# Patient Record
Sex: Male | Born: 1950 | Race: White | Hispanic: No | Marital: Married | Smoking: Never smoker
Health system: Southern US, Community
[De-identification: ages and names within clinical notes are randomized; demographics above are authoritative.]

## PROBLEM LIST (undated history)

## (undated) DIAGNOSIS — F410 Panic disorder [episodic paroxysmal anxiety] without agoraphobia: Secondary | ICD-10-CM

## (undated) DIAGNOSIS — J342 Deviated nasal septum: Secondary | ICD-10-CM

## (undated) DIAGNOSIS — Z9889 Other specified postprocedural states: Secondary | ICD-10-CM

## (undated) DIAGNOSIS — N200 Calculus of kidney: Secondary | ICD-10-CM

## (undated) DIAGNOSIS — K4090 Unilateral inguinal hernia, without obstruction or gangrene, not specified as recurrent: Secondary | ICD-10-CM

## (undated) DIAGNOSIS — Z973 Presence of spectacles and contact lenses: Secondary | ICD-10-CM

## (undated) DIAGNOSIS — M549 Dorsalgia, unspecified: Secondary | ICD-10-CM

## (undated) DIAGNOSIS — Z87442 Personal history of urinary calculi: Secondary | ICD-10-CM

## (undated) DIAGNOSIS — K219 Gastro-esophageal reflux disease without esophagitis: Secondary | ICD-10-CM

## (undated) DIAGNOSIS — S82891A Other fracture of right lower leg, initial encounter for closed fracture: Secondary | ICD-10-CM

## (undated) DIAGNOSIS — C4490 Unspecified malignant neoplasm of skin, unspecified: Secondary | ICD-10-CM

## (undated) DIAGNOSIS — R112 Nausea with vomiting, unspecified: Secondary | ICD-10-CM

## (undated) HISTORY — DX: Dorsalgia, unspecified: M54.9

## (undated) HISTORY — DX: Calculus of kidney: N20.0

## (undated) HISTORY — PX: COLONOSCOPY: SHX174

## (undated) HISTORY — DX: Panic disorder (episodic paroxysmal anxiety): F41.0

## (undated) HISTORY — DX: Unilateral inguinal hernia, without obstruction or gangrene, not specified as recurrent: K40.90

## (undated) HISTORY — PX: NASAL SEPTUM SURGERY: SHX37

## (undated) HISTORY — PX: MOHS SURGERY: SUR867

## (undated) HISTORY — DX: Other fracture of right lower leg, initial encounter for closed fracture: S82.891A

## (undated) HISTORY — DX: Unspecified malignant neoplasm of skin, unspecified: C44.90

---

## 1978-10-30 HISTORY — PX: WISDOM TOOTH EXTRACTION: SHX21

## 2004-04-28 ENCOUNTER — Emergency Department (HOSPITAL_COMMUNITY): Admission: EM | Admit: 2004-04-28 | Discharge: 2004-04-28 | Payer: Self-pay | Admitting: Emergency Medicine

## 2005-02-27 ENCOUNTER — Ambulatory Visit: Payer: Self-pay | Admitting: Family Medicine

## 2005-04-04 ENCOUNTER — Ambulatory Visit: Payer: Self-pay | Admitting: Family Medicine

## 2005-04-25 ENCOUNTER — Ambulatory Visit: Payer: Self-pay | Admitting: Family Medicine

## 2005-08-06 ENCOUNTER — Emergency Department (HOSPITAL_COMMUNITY): Admission: EM | Admit: 2005-08-06 | Discharge: 2005-08-06 | Payer: Self-pay | Admitting: Emergency Medicine

## 2005-10-10 ENCOUNTER — Ambulatory Visit: Payer: Self-pay | Admitting: Family Medicine

## 2006-08-06 ENCOUNTER — Ambulatory Visit: Payer: Self-pay | Admitting: Internal Medicine

## 2006-08-09 ENCOUNTER — Ambulatory Visit: Payer: Self-pay | Admitting: Internal Medicine

## 2007-07-16 ENCOUNTER — Ambulatory Visit: Payer: Self-pay | Admitting: Family Medicine

## 2007-07-16 DIAGNOSIS — Z85828 Personal history of other malignant neoplasm of skin: Secondary | ICD-10-CM | POA: Insufficient documentation

## 2007-09-09 ENCOUNTER — Ambulatory Visit: Payer: Self-pay | Admitting: Family Medicine

## 2007-09-09 LAB — CONVERTED CEMR LAB
ALT: 33 units/L (ref 0–53)
AST: 27 units/L (ref 0–37)
Albumin: 3.8 g/dL (ref 3.5–5.2)
Alkaline Phosphatase: 53 units/L (ref 39–117)
BUN: 11 mg/dL (ref 6–23)
Basophils Absolute: 0.1 10*3/uL (ref 0.0–0.1)
Basophils Relative: 1.3 % — ABNORMAL HIGH (ref 0.0–1.0)
Bilirubin Urine: NEGATIVE
Bilirubin, Direct: 0.1 mg/dL (ref 0.0–0.3)
Blood in Urine, dipstick: NEGATIVE
CO2: 29 meq/L (ref 19–32)
Calcium: 9.3 mg/dL (ref 8.4–10.5)
Chloride: 105 meq/L (ref 96–112)
Cholesterol: 195 mg/dL (ref 0–200)
Creatinine, Ser: 0.9 mg/dL (ref 0.4–1.5)
Eosinophils Absolute: 0.1 10*3/uL (ref 0.0–0.6)
Eosinophils Relative: 2.4 % (ref 0.0–5.0)
GFR calc Af Amer: 112 mL/min
GFR calc non Af Amer: 93 mL/min
Glucose, Bld: 97 mg/dL (ref 70–99)
Glucose, Urine, Semiquant: NEGATIVE
HCT: 45.5 % (ref 39.0–52.0)
HDL: 30.6 mg/dL — ABNORMAL LOW (ref >39.0)
Hemoglobin: 16.2 g/dL (ref 13.0–17.0)
Ketones, urine, test strip: NEGATIVE
LDL Cholesterol: 141 mg/dL — ABNORMAL HIGH (ref 0–99)
Lymphocytes Relative: 37.5 % (ref 12.0–46.0)
MCHC: 35.5 g/dL (ref 30.0–36.0)
MCV: 92.5 fL (ref 78.0–100.0)
Monocytes Absolute: 0.5 10*3/uL (ref 0.2–0.7)
Monocytes Relative: 9.7 % (ref 3.0–11.0)
Neutro Abs: 2.4 10*3/uL (ref 1.4–7.7)
Neutrophils Relative %: 49.1 % (ref 43.0–77.0)
Nitrite: NEGATIVE
PSA: 5.24 ng/mL — ABNORMAL HIGH (ref 0.10–4.00)
Platelets: 215 10*3/uL (ref 150–400)
Potassium: 4.6 meq/L (ref 3.5–5.1)
Protein, U semiquant: NEGATIVE
RBC: 4.92 M/uL (ref 4.22–5.81)
RDW: 12 % (ref 11.5–14.6)
Sodium: 140 meq/L (ref 135–145)
Specific Gravity, Urine: 1.02
TSH: 0.8 microintl units/mL (ref 0.35–5.50)
Total Bilirubin: 0.6 mg/dL (ref 0.3–1.2)
Total CHOL/HDL Ratio: 6.4
Total Protein: 6.4 g/dL (ref 6.0–8.3)
Triglycerides: 115 mg/dL (ref 0–149)
Urobilinogen, UA: 0.2
VLDL: 23 mg/dL (ref 0–40)
WBC Urine, dipstick: NEGATIVE
WBC: 4.9 10*3/uL (ref 4.5–10.5)
pH: 7

## 2007-09-16 ENCOUNTER — Ambulatory Visit: Payer: Self-pay | Admitting: Family Medicine

## 2007-10-28 ENCOUNTER — Ambulatory Visit: Payer: Self-pay | Admitting: Family Medicine

## 2007-10-28 LAB — CONVERTED CEMR LAB: PSA: 5.13 ng/mL — ABNORMAL HIGH (ref 0.10–4.00)

## 2007-11-01 ENCOUNTER — Telehealth: Payer: Self-pay | Admitting: Family Medicine

## 2010-01-05 ENCOUNTER — Telehealth: Payer: Self-pay | Admitting: Internal Medicine

## 2010-03-25 ENCOUNTER — Ambulatory Visit: Payer: Self-pay | Admitting: Family Medicine

## 2010-03-25 DIAGNOSIS — L259 Unspecified contact dermatitis, unspecified cause: Secondary | ICD-10-CM | POA: Insufficient documentation

## 2010-04-19 ENCOUNTER — Ambulatory Visit: Payer: Self-pay | Admitting: Family Medicine

## 2010-07-06 ENCOUNTER — Telehealth: Payer: Self-pay | Admitting: Family Medicine

## 2010-07-12 ENCOUNTER — Ambulatory Visit: Payer: Self-pay | Admitting: Family Medicine

## 2010-07-12 LAB — CONVERTED CEMR LAB
ALT: 43 units/L (ref 0–53)
AST: 31 units/L (ref 0–37)
Albumin: 4.2 g/dL (ref 3.5–5.2)
Alkaline Phosphatase: 42 units/L (ref 39–117)
BUN: 20 mg/dL (ref 6–23)
Bilirubin Urine: NEGATIVE
Bilirubin, Direct: 0.1 mg/dL (ref 0.0–0.3)
Blood in Urine, dipstick: NEGATIVE
CO2: 28 meq/L (ref 19–32)
Calcium: 9.2 mg/dL (ref 8.4–10.5)
Chloride: 106 meq/L (ref 96–112)
Cholesterol: 213 mg/dL — ABNORMAL HIGH (ref 0–200)
Creatinine, Ser: 1 mg/dL (ref 0.4–1.5)
Direct LDL: 145.3 mg/dL
GFR calc non Af Amer: 82.09 mL/min (ref >60)
Glucose, Bld: 95 mg/dL (ref 70–99)
Glucose, Urine, Semiquant: NEGATIVE
HCT: 47.8 % (ref 39.0–52.0)
HDL: 38.4 mg/dL — ABNORMAL LOW (ref >39.00)
Hemoglobin: 16.7 g/dL (ref 13.0–17.0)
Ketones, urine, test strip: NEGATIVE
MCHC: 35 g/dL (ref 30.0–36.0)
MCV: 95.5 fL (ref 78.0–100.0)
Nitrite: NEGATIVE
PSA: 3.12 ng/mL (ref 0.10–4.00)
Platelets: 182 10*3/uL (ref 150.0–400.0)
Potassium: 4.3 meq/L (ref 3.5–5.1)
Protein, U semiquant: NEGATIVE
RBC: 5 M/uL (ref 4.22–5.81)
RDW: 13 % (ref 11.5–14.6)
Sodium: 141 meq/L (ref 135–145)
Specific Gravity, Urine: 1.02
TSH: 1.34 microintl units/mL (ref 0.35–5.50)
Total Bilirubin: 0.8 mg/dL (ref 0.3–1.2)
Total CHOL/HDL Ratio: 6
Total Protein: 6.9 g/dL (ref 6.0–8.3)
Triglycerides: 240 mg/dL — ABNORMAL HIGH (ref 0.0–149.0)
Urobilinogen, UA: 0.2
VLDL: 48 mg/dL — ABNORMAL HIGH (ref 0.0–40.0)
WBC Urine, dipstick: NEGATIVE
WBC: 5.2 10*3/uL (ref 4.5–10.5)
pH: 5.5

## 2010-07-22 ENCOUNTER — Encounter: Payer: Self-pay | Admitting: Family Medicine

## 2010-07-22 ENCOUNTER — Ambulatory Visit: Payer: Self-pay | Admitting: Family Medicine

## 2010-11-29 NOTE — Progress Notes (Signed)
Summary: REFILL REQUEST  Phone Note Refill Request Message from:  Patient on July 06, 2010 11:08 AM  Refills Requested: Medication #1:  ALPRAZOLAM 0.5 MG  TABS   Notes: [facility] - [address].    Initial call taken by: Debbra Riding,  July 06, 2010 11:08 AM  Follow-up for Phone Call        Nadine......... dispense 30 tabs directions one as needed for panic attacks.  No refills.  Also, transfer him to [facility]a......... he needs to get set up for his complete physical exam Follow-up by: Roderick Pee MD,  July 06, 2010 12:35 PM  Additional Follow-up for Phone Call Additional follow up Details #1::        Called home number he was not home,spoke with his wife she said she will motivate him to make apt. Rx called into [facility] [phone] Additional Follow-up by: Kathrynn Speed CMA,  July 06, 2010 4:39 PM     Appended Document: REFILL REQUEST Pt called and scheduled appt for cpx with Dr Tawanna Cooler on  9/21.

## 2010-11-29 NOTE — Assessment & Plan Note (Signed)
Summary: RASH ON FACE/PS   Vital Signs:  Patient profile:   60 year old male Weight:      210 pounds BMI:     31.12 Temp:     98.2 degrees F oral BP sitting:   150 / 90  (left arm) Cuff size:   regular  Vitals Entered By: Kern Reap CMA Duncan Dull) (Mar 25, 2010 1:40 PM) CC: rash on face   CC:  rash on face.  History of Present Illness: Andrew Mcmahon is a 60 year old, married male, nonsmoker, who comes in today for evaluation of a contact dermatitis.  This morning he woke up and noticed some itching of the scalp then he broke out all over on his face.  No previous history of contact dermatitis  Allergies: No Known Drug Allergies  Past History:  Past medical, surgical, family and social histories (including risk factors) reviewed for relevance to current acute and chronic problems.  Past Medical History: Reviewed history from 09/16/2007 and no changes required. Skin cancer, hx of Low back pain panic attacks  Past Surgical History: Reviewed history from 07/16/2007 and no changes required. Deviated Septum Colonoscopy  Family History: Reviewed history from 07/16/2007 and no changes required. Family History Liver disease Family History of Melanoma  Social History: Reviewed history from 07/16/2007 and no changes required. Occupation: Married Current Smoker Alcohol use-yes Regular exercise-yes  Review of Systems      See HPI  Physical Exam  General:  Well-developed,well-nourished,in no acute distress; alert,appropriate and cooperative throughout examination Skin:  skin rash, consistent with a contact dermatitis   Problems:  Medical Problems Added: 1)  Dx of Contact Dermatitis&other Eczema Due Unspec Cause  (ICD-692.9)  Impression & Recommendations:  Problem # 1:  CONTACT DERMATITIS&OTHER ECZEMA DUE UNSPEC CAUSE (ICD-692.9) Assessment New  His updated medication list for this problem includes:    Prednisone 20 Mg Tabs (Prednisone) ..... Uad  Complete  Medication List: 1)  Alprazolam 0.5 Mg Tabs (Alprazolam) 2)  Prednisone 20 Mg Tabs (Prednisone) .... Uad  Patient Instructions: 1)  begin prednisone two tabs now then two tabs every morning for 3 days, one for 3 days, a half for 3 days, then half a tablet Monday, Wednesday, Friday, for a two week taper Prescriptions: PREDNISONE 20 MG TABS (PREDNISONE) UAD  #40 x 1   Entered and Authorized by:   Roderick Pee MD   Signed by:   Roderick Pee MD on 03/25/2010   Method used:   Electronically to        [facility]. [phone]* (retail)       [phone]       [place], [place]  [address]       Ph: [phone]       Fax: [phone]   RxID:   [id number]

## 2010-11-29 NOTE — Assessment & Plan Note (Signed)
Summary: CPX // RS----PT [facility] // RS   Vital Signs:  Patient profile:   60 year old male Height:      69 inches Weight:      207 pounds BMI:     30.68 Temp:     98.2 degrees F oral BP sitting:   130 / 80  (left arm) Cuff size:   regular  Vitals Entered By: Kern Reap CMA Duncan Dull) (July 22, 2010 2:30 PM) CC: cpx Is Patient Diabetic? No Pain Assessment Patient in pain? no        CC:  cpx.  History of Present Illness: Andrew Mcmahon is a 60 year old, married male, nonsmoker, who comes in today for general physical examination  He's always been in excellent health.  He said no chronic health problems.  He takes no medication on a regular basis.  He has had some skin cancers........ nonmelanoma........ in the past.  Is now meticulous about sunscreens.  He exercises on a daily basis swimming and push-ups, however, descending, trouble with his right shoulder.  Advised to stop the push-ups.  He takes alprazolam .5 p.r.n. for panic attacks.  It [place] the [place] these.  He gets routine eye care, dental care, colonoscopy, normal 6 years ago, tetanus, 2004  Allergies (verified): No Known Drug Allergies  Past History:  Past medical, surgical, family and social histories (including risk factors) reviewed, and no changes noted (except as noted below).  Past Medical History: Reviewed history from 09/16/2007 and no changes required. Skin cancer, hx of Low back pain panic attacks  Past Surgical History: Reviewed history from 07/16/2007 and no changes required. Deviated Septum Colonoscopy  Family History: Reviewed history from 07/16/2007 and no changes required. Family History Liver disease Family History of Melanoma  Social History: Reviewed history from 07/16/2007 and no changes required. Occupation: Married Current Smoker Alcohol use-yes Regular exercise-yes  Review of Systems      See HPI  Physical Exam  General:   Well-developed,well-nourished,in no acute distress; alert,appropriate and cooperative throughout examination Head:  Normocephalic and atraumatic without obvious abnormalities. No apparent alopecia or balding. Eyes:  No corneal or conjunctival inflammation noted. EOMI. Perrla. Funduscopic exam benign, without hemorrhages, exudates or papilledema. Vision grossly normal. Ears:  External ear exam shows no significant lesions or deformities.  Otoscopic examination reveals clear canals, tympanic membranes are intact bilaterally without bulging, retraction, inflammation or discharge. Hearing is grossly normal bilaterally. Nose:  External nasal examination shows no deformity or inflammation. Nasal mucosa are pink and moist without lesions or exudates. Mouth:  Oral mucosa and oropharynx without lesions or exudates.  Teeth in good repair. Neck:  No deformities, masses, or tenderness noted. Chest Wall:  No deformities, masses, tenderness or gynecomastia noted. Breasts:  No masses or gynecomastia noted Lungs:  Normal respiratory effort, chest expands symmetrically. Lungs are clear to auscultation, no crackles or wheezes. Heart:  Normal rate and regular rhythm. S1 and S2 normal without gallop, murmur, click, rub or other extra sounds. Abdomen:  Bowel sounds positive,abdomen soft and non-tender without masses, organomegaly or hernias noted. Rectal:  No external abnormalities noted. Normal sphincter tone. No rectal masses or tenderness. Genitalia:  Testes bilaterally descended without nodularity, tenderness or masses. No scrotal masses or lesions. No penis lesions or urethral discharge. Prostate:  Prostate gland firm and smooth, no enlargement, nodularity, tenderness, mass, asymmetry or induration. Msk:  No deformity or scoliosis noted of thoracic or lumbar spine.   Pulses:  R and L carotid,radial,femoral,dorsalis pedis and  posterior tibial pulses are full and equal bilaterally Extremities:  No clubbing, cyanosis,  edema, or deformity noted with normal full range of motion of all joints.   Neurologic:  No cranial nerve deficits noted. Station and gait are normal. Plantar reflexes are down-going bilaterally. DTRs are symmetrical throughout. Sensory, motor and coordinative functions appear intact. Skin:  Intact without suspicious lesions or rashes Cervical Nodes:  No lymphadenopathy noted Axillary Nodes:  No palpable lymphadenopathy Inguinal Nodes:  No significant adenopathy Psych:  Cognition and judgment appear intact. Alert and cooperative with normal attention span and concentration. No apparent delusions, illusions, hallucinations   Impression & Recommendations:  Problem # 1:  Preventive Health Care (ICD-V70.0) Assessment Unchanged  Complete Medication List: 1)  Alprazolam 0.5 Mg Tabs (Alprazolam)  Other Orders: EKG w/ Interpretation (93000) Flu Vaccine 52yrs + (16109) Admin 1st Vaccine (60454)  Patient Instructions: 1)  Please schedule a follow-up appointment in 1 year. 2)  It is important that you exercise regularly at least 20 minutes 5 times a week. If you develop chest pain, have severe difficulty breathing, or feel very tired , stop exercising immediately and seek medical attention. 3)  Schedule a colonoscopy/sigmoidoscopy to help detect colon cancer. 4)  Take an Aspirin every day.   Immunizations Administered:  Influenza Vaccine # 1:    Vaccine Type: Fluvax 3+    Site: left deltoid    Mfr: GlaxoSmithKline    Dose: 0.5 ml    Route: IM    Given by: Kern Reap CMA (AAMA)    Exp. Date: 04/29/2011    Lot #: [id number]    VIS given: 05/24/10 version given July 22, 2010.    Physician counseled: yes

## 2010-11-29 NOTE — Procedures (Signed)
Summary: COLONOSCOPY    Patient Name: Andrew Mcmahon, Andrew Mcmahon MRN:  Procedure Procedures: Colonoscopy CPT: [phone].  Personnel: Endoscopist: Laquonda Welby L. Juanda Chance, MD.  Referred By: Eugenio Hoes Tawanna Cooler, MD.  Exam Location: Exam performed in Outpatient Clinic. Outpatient  Patient Consent: Procedure, Alternatives, Risks and Benefits discussed, consent obtained, from patient. Consent was obtained by the RN.  Indications  Average Risk Screening Routine.  History  Current Medications: Patient is not currently taking Coumadin.  Pre-Exam Physical: Performed Aug 09, 2006. Entire physical exam was normal.  Comments: Pt. history reviewed/updated, physical exam performed prior to initiation of sedation?yes Exam Exam: Extent of exam reached: Cecum, extent intended: Cecum.  The cecum was identified by appendiceal orifice and IC valve. Colon retroflexion performed. Images taken. ASA Classification: I. Tolerance: good.  Monitoring: Pulse and BP monitoring, Oximetry used. Supplemental O2 given.  Colon Prep Used Moviprep for colon prep. Prep results: good.  Sedation Meds: Patient assessed and found to be appropriate for moderate (conscious) sedation. Fentanyl 100 mcg. given IV. Versed 9 mg. given IV.  Findings - NORMAL EXAM: Cecum.  - DIVERTICULOSIS: Descending Colon to Sigmoid Colon. ICD9: Diverticulosis: 562.10. Comments: mild sigmoid diverticulosis.  - HEMORRHOIDS: Internal. Size: Grade I. ICD9: Hemorrhoids, Internal: 455.0. Comments: small internal hemorrhoids.   Assessment Normal examination.  Diagnoses: 455.0: Hemorrhoids, Internal.  562.10: Diverticulosis.   Comments: no polyps Events  Unplanned Interventions: No intervention was required.  Unplanned Events: There were no complications. Plans Patient Education: Patient given standard instructions for: Patient instructed to get routine colonoscopy every 10 years.  Comments: follow up with Dr Tawanna Cooler Disposition: After procedure  patient sent to recovery. After recovery patient sent home.    CC: Tinnie Gens A.Todd MD  This report was created from the original endoscopy report, which was reviewed and signed by the above listed endoscopist.

## 2010-11-29 NOTE — Progress Notes (Signed)
Summary: TRIAGE- hemorroids  Phone Note Call from Patient Call back at Home Phone [phone]   Caller: Patient Call For: Andrew Mcmahon Reason for Call: Talk to Nurse Summary of Call: Wife states that patient is having a lot of problems with hemorroids wants to know if Dr Andrew Mcmahon can referred him to a hemorroid specialist or to have them removed. Initial call taken by: Tawni Levy,  January 05, 2010 9:12 AM  Follow-up for Phone Call        Pt. had a Colonoscopy on 08-09-2006, hasn't ever been seen in the office. Per pt. wife-since 2007 pt. has intermittent problems with hemorrhoids. Has rectal pain, burning and itching. Not sure if he is bleeding. He does prep.H treatment, does sitz baths, applies witch hazel as needed. Wife states he is traveling out of town at this time. I have advised for him to continue above treatment and to see Dr.Lexiana Spindel for a complete exam and further treatment. I offered an appt. for 01-10-10, pt. wife declines to schedule, states pt. will need to call and set up appointment himself. Pt. wife will have him call to get appt. scheduled.  Follow-up by: Laureen Ochs LPN,  January 05, 2010 10:33 AM  Additional Follow-up for Phone Call Additional follow up Details #1::        Anusol HC supp Insert 1 two times a day  Additional Follow-up by: Hart Carwin MD,  January 05, 2010 4:35 PM    Additional Follow-up for Phone Call Additional follow up Details #2::    Above MD orders reviewed with patient's wife. Pt. will call next week to schedule an appt. with Dr.Corvette Orser. Pt. instructed to call back as needed.  Follow-up by: Laureen Ochs LPN,  January 05, 2010 5:04 PM  New/Updated Medications: ANUSOL-HC 25 MG  SUPP (HYDROCORTISONE ACETATE) Put 1 in rectum 2 times daily for 7 days. Prescriptions: ANUSOL-HC 25 MG  SUPP (HYDROCORTISONE ACETATE) Put 1 in rectum 2 times daily for 7 days.  #14 x 1   Entered by:   Laureen Ochs LPN   Authorized by:   Hart Carwin MD   Signed by:    Laureen Ochs LPN on 46/96/2952   Method used:   Electronically to        [facility]. [phone]* (retail)       [phone]       [place], [place]  [address]       Ph: [phone]       Fax: [phone]   RxID:   [id number]

## 2010-11-29 NOTE — Assessment & Plan Note (Signed)
Summary: ear stopped up./dm   Vital Signs:  Patient profile:   60 year old male Weight:      208 pounds Temp:     98.2 degrees F oral BP sitting:   140 / 90  (left arm) Cuff size:   regular  Vitals Entered By: Kern Reap CMA Duncan Dull) (April 19, 2010 11:51 AM) CC: ears clogged   CC:  ears clogged.  History of Present Illness: Andrew Mcmahon is a 60 year old, married male, nonsmoker, who comes in today for evaluation of bilateral hearing loss.  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 1 or 2 gtts at bedtime  Patient Instructions: 1)  if u  developed pain in your ears, used the medicated drops for a week, one or two drops at bedtime Prescriptions: CORTISPORIN-TC 3.12-30-08-0.5 MG/ML SUSP (NEOMYCIN-COLIST-HC-THONZONIUM) 1 or 2 gtts at bedtime  #10 cc x 2   Entered and Authorized by:   Roderick Pee MD   Signed by:   Roderick Pee MD on 04/19/2010   Method used:     RxID:   [id number]

## 2010-12-27 ENCOUNTER — Ambulatory Visit (INDEPENDENT_AMBULATORY_CARE_PROVIDER_SITE_OTHER): Payer: BC Managed Care – PPO | Admitting: Family Medicine

## 2010-12-27 ENCOUNTER — Encounter: Payer: Self-pay | Admitting: Family Medicine

## 2010-12-27 DIAGNOSIS — Z1833 Retained wood fragments: Secondary | ICD-10-CM

## 2010-12-27 NOTE — Progress Notes (Signed)
  Subjective:    Patient ID: Andrew Mcmahon, male    DOB: Oct 23, 1951, 60 y.o.   MRN: [id number]  HPI  Andrew Mcmahon is a 60 year old man now nonsmoker, who comes in today for removal of a foreign body in his left thumb x 2 weeks.  He states that two weeks ago.  He got a splinter in his left thumb and it won't come out.  He try cutting it out at home.  Review of Systems    Negative Objective:   Physical Exam    Well-developed well-nourished, male in no acute distress.  Examination left thumb shows a pustule at the distal tip.  After alcohol prep.  The pus was expressed.  A half-inch to splint was removed.  Dry sterile dressing was applied.  He is advised on soaking in wound care.  Return p.r.n.    Assessment & Plan:  Foreign body, left thumb........[name] Kitchen Removed without difficulty.  Return p.r.n.

## 2010-12-27 NOTE — Patient Instructions (Signed)
Warm soaks twice daily.  Return p.r.n.

## 2011-03-23 ENCOUNTER — Encounter: Payer: Self-pay | Admitting: Family Medicine

## 2011-03-23 ENCOUNTER — Ambulatory Visit (INDEPENDENT_AMBULATORY_CARE_PROVIDER_SITE_OTHER): Payer: BC Managed Care – PPO | Admitting: Family Medicine

## 2011-03-23 VITALS — BP 140/80 | Temp 98.2°F | Ht 71.0 in | Wt 208.0 lb

## 2011-03-23 DIAGNOSIS — H9201 Otalgia, right ear: Secondary | ICD-10-CM

## 2011-03-23 DIAGNOSIS — H9209 Otalgia, unspecified ear: Secondary | ICD-10-CM

## 2011-03-23 MED ORDER — CIPROFLOXACIN-HYDROCORTISONE 0.2-1 % OT SUSP: OTIC | Status: AC

## 2011-03-23 NOTE — Progress Notes (Signed)
  Subjective:    Patient ID: LEVEON PELZER, male    DOB: Mar 29, 1951, 61 y.o.   MRN: [id number]  HPI       Viggo is a 60 year old male, who comes in today for evaluation of pain in his right ear for two days.  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  He said no fever, chills, et Karie Soda.    Review of Systems General an ENT review of systems otherwise negative    Objective:   Physical Exam    Well-developed well-nourished man in no acute distress.  Right ear shows cerumen impaction, which was removed by suction.  No complications in her canal now looks normal    Assessment & Plan:  Right ear pain, secondary to ear wax.  Return p.r.n.

## 2011-03-23 NOTE — Patient Instructions (Signed)
Use the Cipro otic drops as needed

## 2011-06-29 ENCOUNTER — Ambulatory Visit: Payer: BC Managed Care – PPO | Admitting: Family Medicine

## 2011-07-26 ENCOUNTER — Encounter: Payer: Self-pay | Admitting: Family Medicine

## 2011-07-26 ENCOUNTER — Ambulatory Visit (INDEPENDENT_AMBULATORY_CARE_PROVIDER_SITE_OTHER): Payer: BC Managed Care – PPO | Admitting: Family Medicine

## 2011-07-26 VITALS — BP 140/84 | Temp 98.4°F | Wt 201.0 lb

## 2011-07-26 DIAGNOSIS — R0683 Snoring: Secondary | ICD-10-CM

## 2011-07-26 DIAGNOSIS — R0609 Other forms of dyspnea: Secondary | ICD-10-CM

## 2011-07-26 DIAGNOSIS — D0439 Carcinoma in situ of skin of other parts of face: Secondary | ICD-10-CM

## 2011-07-26 DIAGNOSIS — Z85828 Personal history of other malignant neoplasm of skin: Secondary | ICD-10-CM

## 2011-07-26 DIAGNOSIS — L989 Disorder of the skin and subcutaneous tissue, unspecified: Secondary | ICD-10-CM

## 2011-07-26 DIAGNOSIS — D0430 Carcinoma in situ of skin of unspecified part of face: Secondary | ICD-10-CM

## 2011-07-26 DIAGNOSIS — F410 Panic disorder [episodic paroxysmal anxiety] without agoraphobia: Secondary | ICD-10-CM

## 2011-07-26 LAB — BASIC METABOLIC PANEL
BUN: 18 mg/dL (ref 6–23)
CO2: 25 mEq/L (ref 19–32)
Calcium: 9.2 mg/dL (ref 8.4–10.5)
Chloride: 104 mEq/L (ref 96–112)
Creatinine, Ser: 1 mg/dL (ref 0.4–1.5)
GFR: 83.76 mL/min (ref >60.00)
Glucose, Bld: 87 mg/dL (ref 70–99)
Potassium: 4.3 mEq/L (ref 3.5–5.1)
Sodium: 140 mEq/L (ref 135–145)

## 2011-07-26 LAB — HEPATIC FUNCTION PANEL
ALT: 30 U/L (ref 0–53)
AST: 23 U/L (ref 0–37)
Albumin: 4.3 g/dL (ref 3.5–5.2)
Alkaline Phosphatase: 58 U/L (ref 39–117)
Bilirubin, Direct: 0.1 mg/dL (ref 0.0–0.3)
Total Bilirubin: 0.6 mg/dL (ref 0.3–1.2)
Total Protein: 7.3 g/dL (ref 6.0–8.3)

## 2011-07-26 LAB — POCT URINALYSIS DIPSTICK
Bilirubin, UA: NEGATIVE
Blood, UA: NEGATIVE
Glucose, UA: NEGATIVE
Ketones, UA: NEGATIVE
Leukocytes, UA: NEGATIVE
Nitrite, UA: NEGATIVE
Protein, UA: NEGATIVE
Spec Grav, UA: 1.02
Urobilinogen, UA: 0.2
pH, UA: 6

## 2011-07-26 LAB — CBC WITH DIFFERENTIAL/PLATELET
Basophils Absolute: 0.1 10*3/uL (ref 0.0–0.1)
Basophils Relative: 1.1 % (ref 0.0–3.0)
Eosinophils Absolute: 0.1 10*3/uL (ref 0.0–0.7)
Eosinophils Relative: 2 % (ref 0.0–5.0)
HCT: 49.1 % (ref 39.0–52.0)
Hemoglobin: 16.7 g/dL (ref 13.0–17.0)
Lymphocytes Relative: 35.4 % (ref 12.0–46.0)
Lymphs Abs: 1.9 10*3/uL (ref 0.7–4.0)
MCHC: 34 g/dL (ref 30.0–36.0)
MCV: 95.2 fl (ref 78.0–100.0)
Monocytes Absolute: 0.5 10*3/uL (ref 0.1–1.0)
Monocytes Relative: 9.2 % (ref 3.0–12.0)
Neutro Abs: 2.9 10*3/uL (ref 1.4–7.7)
Neutrophils Relative %: 52.3 % (ref 43.0–77.0)
Platelets: 220 10*3/uL (ref 150.0–400.0)
RBC: 5.16 Mil/uL (ref 4.22–5.81)
RDW: 13.1 % (ref 11.5–14.6)
WBC: 5.5 10*3/uL (ref 4.5–10.5)

## 2011-07-26 LAB — LIPID PANEL
Cholesterol: 232 mg/dL — ABNORMAL HIGH (ref 0–200)
HDL: 40.8 mg/dL (ref >39.00)
Total CHOL/HDL Ratio: 6
Triglycerides: 266 mg/dL — ABNORMAL HIGH (ref 0.0–149.0)
VLDL: 53.2 mg/dL — ABNORMAL HIGH (ref 0.0–40.0)

## 2011-07-26 LAB — PSA: PSA: 2.68 ng/mL (ref 0.10–4.00)

## 2011-07-26 LAB — TSH: TSH: 0.92 u[IU]/mL (ref 0.35–5.50)

## 2011-07-26 LAB — LDL CHOLESTEROL, DIRECT: Direct LDL: 158.9 mg/dL

## 2011-07-26 MED ORDER — ALPRAZOLAM 0.5 MG PO TABS: 0.5000 mg | ORAL_TABLET | Freq: Every evening | ORAL | Status: DC | PRN

## 2011-07-26 NOTE — Progress Notes (Signed)
  Subjective:    Patient ID: Andrew Mcmahon, male    DOB: 12/10/1950, 60 y.o.   MRN: [id number]  HPI  Andrew Mcmahon is a 60-year-old male, who comes in today with 4 questions.  He has a nonhealing lesion on the right side of his face he would like removed.  He would like a refill on his Xanax which he takes .5 mg nightly p.r.n.  He takes less than 30 tablets a year.  His wife is complaining because she thinks he has sleep apnea.  He would like an evaluation.  The symptoms are gasping for air snoring, but no fatigue during the day.  He will also like to discuss his shingles.  Vaccine.  After informed consent he was taken to the treatment room.  The lesion was cleaned with alcohol.  It was measured 8 mm x 8 mm shave excision was done.  The base was cauterized.  He tolerated the procedure well.  No complications.  The lesion was sent for pathologic analysis.  Review of Systems    General psychiatric, dermatologic review of systems otherwise negative except for previous history of skin cancer. Objective:   Physical Exam  Procedure see above      Assessment & Plan:  Abnormal lesion, right side of face removed path pending.  History is possible sleep apnea.  Pulmonary consult.  History of occasional anxiety attacks, Xanax, .5, p.r.n.  Check shingles vaccine with insurance company

## 2011-07-26 NOTE — Patient Instructions (Addendum)
We will call you within two weeks with the path report on the lesion are removed from her face.  I put in a request for a consult with Dr. Mellody Dance pulmonary dilation for possible sleep apnea.  Return for your annual physical examination sooner if any problems.  Call us if you decide you want to shingles faxing  We will do your lab work today and set up a 30 minute appointment sometime in the next couple weeks for general physical examination  On Monday, October 1, I get the path report, which showed a squamous cell carcinoma.  He was referred to Dr. Margo Aye for further surgical treatment

## 2011-08-10 ENCOUNTER — Encounter: Payer: Self-pay | Admitting: Family Medicine

## 2011-08-10 ENCOUNTER — Ambulatory Visit (INDEPENDENT_AMBULATORY_CARE_PROVIDER_SITE_OTHER): Payer: BC Managed Care – PPO | Admitting: Family Medicine

## 2011-08-10 DIAGNOSIS — R0683 Snoring: Secondary | ICD-10-CM

## 2011-08-10 DIAGNOSIS — R0609 Other forms of dyspnea: Secondary | ICD-10-CM

## 2011-08-10 DIAGNOSIS — L989 Disorder of the skin and subcutaneous tissue, unspecified: Secondary | ICD-10-CM

## 2011-08-10 DIAGNOSIS — Z23 Encounter for immunization: Secondary | ICD-10-CM

## 2011-08-10 DIAGNOSIS — F410 Panic disorder [episodic paroxysmal anxiety] without agoraphobia: Secondary | ICD-10-CM

## 2011-08-10 DIAGNOSIS — Z85828 Personal history of other malignant neoplasm of skin: Secondary | ICD-10-CM

## 2011-08-10 DIAGNOSIS — Z2911 Encounter for prophylactic immunotherapy for respiratory syncytial virus (RSV): Secondary | ICD-10-CM

## 2011-08-10 DIAGNOSIS — Z0000 Encounter for general adult medical examination without abnormal findings: Secondary | ICD-10-CM

## 2011-08-10 DIAGNOSIS — D0439 Carcinoma in situ of skin of other parts of face: Secondary | ICD-10-CM

## 2011-08-10 DIAGNOSIS — D0430 Carcinoma in situ of skin of unspecified part of face: Secondary | ICD-10-CM

## 2011-08-10 MED ORDER — ALPRAZOLAM 0.5 MG PO TABS: 0.5000 mg | ORAL_TABLET | Freq: Every evening | ORAL | Status: DC | PRN

## 2011-08-10 NOTE — Patient Instructions (Signed)
Continue y , good health habits.  Return in one year for general physical examination sooner if any problem

## 2011-08-10 NOTE — Progress Notes (Signed)
  Subjective:    Patient ID: Andrew Mcmahon, male    DOB: Jun 30, 1951, 60 y.o.   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  He has no chronic health problems.  He takes a half of 8.5, Xanax tablet [place]  He has some mini type panic attacks associated with driving.  He stated does see Dr. Irene Limbo next week for Mohs surgery.  We excised the lesion on the right side of his face.  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  He takes no medicine on a regular basis except for the Xanax as noted above.  P.r.n., aspirin, facial and probiotics    Review of Systems  Constitutional: Negative.   HENT: Negative.   Eyes: Negative.   Respiratory: Negative.   Cardiovascular: Negative.   Gastrointestinal: Negative.   Genitourinary: Negative.   Musculoskeletal: Negative.   Skin: Negative.   Neurological: Negative.   Hematological: Negative.   Psychiatric/Behavioral: Negative.        Objective:   Physical Exam  Constitutional: He is oriented to person, place, and time. He appears well-developed and well-nourished.  HENT:  Head: Normocephalic and atraumatic.  Right Ear: External ear normal.  Left Ear: External ear normal.  Nose: Nose normal.  Mouth/Throat: Oropharynx is clear and moist.  Eyes: Conjunctivae and EOM are normal. Pupils are equal, round, and reactive to light.  Neck: Normal range of motion. Neck supple. No JVD present. No tracheal deviation present. No thyromegaly present.  Cardiovascular: Normal rate, regular rhythm, normal heart sounds and intact distal pulses.  Exam reveals no gallop and no friction rub.   No murmur heard. Pulmonary/Chest: Effort normal and breath sounds normal. No stridor. No respiratory distress. He has no wheezes. He has no rales. He exhibits no tenderness.  Abdominal: Soft. Bowel sounds  are normal. He exhibits no distension and no mass. There is no tenderness. There is no rebound and no guarding.  Genitourinary: Rectum normal, prostate normal and penis normal. Guaiac negative stool. No penile tenderness.  Musculoskeletal: Normal range of motion. He exhibits no edema and no tenderness.  Lymphadenopathy:    He has no cervical adenopathy.  Neurological: He is alert and oriented to person, place, and time. He has normal reflexes. No cranial nerve deficit. He exhibits normal muscle tone.  Skin: Skin is warm and dry. No rash noted. No erythema. No pallor.       Superficial scar, right face, where I removed the lesion two weeks ago.  Well-healed total body skin exam otherwise, negative  Psychiatric: He has a normal mood and affect. His behavior is normal. Judgment and thought content normal.          Assessment & Plan:  Healthy male.  History of many panic attacks [place].  Continue .25 of Xanax p.r.n.  Squamous cell carcinoma, right face Mohs surgery next week.  Dr. Irene Limbo.  Follow-up in one year or sooner if any problems

## 2011-08-14 ENCOUNTER — Encounter: Payer: Self-pay | Admitting: Pulmonary Disease

## 2011-08-14 ENCOUNTER — Ambulatory Visit (INDEPENDENT_AMBULATORY_CARE_PROVIDER_SITE_OTHER): Payer: BC Managed Care – PPO | Admitting: Pulmonary Disease

## 2011-08-14 VITALS — BP 122/70 | HR 62 | Temp 98.4°F | Ht 71.0 in | Wt 204.6 lb

## 2011-08-14 DIAGNOSIS — R0609 Other forms of dyspnea: Secondary | ICD-10-CM

## 2011-08-14 DIAGNOSIS — R0683 Snoring: Secondary | ICD-10-CM | POA: Insufficient documentation

## 2011-08-14 NOTE — Progress Notes (Signed)
  Subjective:    Patient ID: Andrew Mcmahon, male    DOB: 07/18/1951, 60 y.o.   MRN: [id number]  HPI The patient is a 60 year old male who I've been asked to see for possible sleep apnea.  He has been noted to have loud snoring, and his spouse has witnessed apnea at times.  He also admits to having snoring arousals.  He feels rested most mornings, and describes only mild sleep pressure with reading primarily in the afternoons.  He can catnap at times during the afternoon.  He describes some sleepiness in the evenings while watching television or movies, but has no issues with sleepiness while driving.  The patient states that his weight is neutral over the last 2 years, and his Epworth score today is 10.  Sleep Questionnaire: What time do you typically go to bed?( Between what hours) 10pm How long does it take you to fall asleep? 20 mins How many times during the night do you wake up? 1 What time do you get out of bed to start your day? 0630 Do you drive or operate heavy machinery in your occupation? No How much has your weight changed (up or down) over the past two years? (In pounds) 0 oz (0 kg) Have you ever had a sleep study before? No Do you currently use CPAP? No Do you wear oxygen at any time? No     Review of Systems  Constitutional: Negative for fever and unexpected weight change.  HENT: Positive for dental problem. Negative for ear pain, nosebleeds, congestion, sore throat, rhinorrhea, sneezing, trouble swallowing, postnasal drip and sinus pressure.   Eyes: Negative for redness and itching.  Respiratory: Negative for cough, chest tightness, shortness of breath and wheezing.   Cardiovascular: Negative for palpitations and leg swelling.  Gastrointestinal: Negative for nausea and vomiting.  Genitourinary: Negative for dysuria.  Musculoskeletal: Negative for joint swelling.  Skin: Negative for rash.  Neurological: Negative for headaches.  Hematological: Does not bruise/bleed easily.    Psychiatric/Behavioral: Negative for dysphoric mood. The patient is not nervous/anxious.        Objective:   Physical Exam Constitutional:  Well developed, no acute distress  HENT:  Nares patent without discharge, deviated septum to left with narrowing.   Oropharynx without exudate, palate and uvula are elongated and thickened.    Eyes:  Perrla, eomi, no scleral icterus  Neck:  No JVD, no TMG  Cardiovascular:  Normal rate, regular rhythm, no rubs or gallops.  No murmurs        Intact distal pulses  Pulmonary :  Normal breath sounds, no stridor or respiratory distress   No rales, rhonchi, or wheezing  Abdominal:  Soft, nondistended, bowel sounds present.  No tenderness noted.   Musculoskeletal:  No lower extremity edema noted.  Lymph Nodes:  No cervical lymphadenopathy noted  Skin:  No cyanosis noted  Neurologic:  Alert, appropriate, moves all 4 extremities without obvious deficit.         Assessment & Plan:

## 2011-08-14 NOTE — Patient Instructions (Signed)
Will schedule for home sleep testing to put the issue of possible sleep apnea to rest. Work on weight loss Will call once results are available.

## 2011-08-14 NOTE — Assessment & Plan Note (Signed)
The patient has loud snoring by history, and his family has witnessed probable apneic episodes.  He denies having nonrestorative sleep, but does have some sleep pressure during the day with reading, and also in the evenings while watching TV or movies.  It is unclear whether he has symptomatic snoring, or frank sleep apnea that may be clinically significant.  A sleep study will be needed to differentiate.  Because the patient is fairly healthy, he would be a good candidate for home sleep testing.

## 2011-09-22 ENCOUNTER — Ambulatory Visit (INDEPENDENT_AMBULATORY_CARE_PROVIDER_SITE_OTHER): Payer: BC Managed Care – PPO | Admitting: Internal Medicine

## 2011-09-22 ENCOUNTER — Encounter: Payer: Self-pay | Admitting: Internal Medicine

## 2011-09-22 VITALS — BP 150/90 | Temp 97.8°F | Wt 203.0 lb

## 2011-09-22 DIAGNOSIS — M25559 Pain in unspecified hip: Secondary | ICD-10-CM

## 2011-09-22 DIAGNOSIS — M25551 Pain in right hip: Secondary | ICD-10-CM

## 2011-09-22 DIAGNOSIS — M549 Dorsalgia, unspecified: Secondary | ICD-10-CM | POA: Insufficient documentation

## 2011-09-22 DIAGNOSIS — R030 Elevated blood-pressure reading, without diagnosis of hypertension: Secondary | ICD-10-CM

## 2011-09-22 NOTE — Patient Instructions (Addendum)
Antiinflammatory    And pain med for now . Ok to use hydrocodone if needed.  As add on med.  Take 2 - 2.5    Twice a day for 10 days   Will do ortho  Consults.

## 2011-09-22 NOTE — Progress Notes (Signed)
  Subjective:    Patient ID: Andrew Mcmahon, male    DOB: 1951/01/24, 60 y.o.   MRN: [id number]  HPI  Patient comes in today for SDA  For acute problem evaluation. Onset 5 days ago and no injury Insidious onset . Began with RLB ( as in past )  Then went to y and used whirlpool and streth and then   Got worse AN NOW ACHING TO RIGHT LATERAL HIP.   The 600 ibupropen  Some help initially but  in last 2 days no effect.  Now having right thigh leg. Area .  Some aching to thigh. Knee ok   Tried otc pain patch.  An no help.  An continued ibuprofen .    Worse after driving.  Tried left over pain med from root canal.    Some help. ( hydrocodone)  Doesn't feel  Like renal stone.  He has had many of these.  Review of Systems NO fever  Hematuria  And no NVD numbness for falling  Never had HT  No cp sob swelling    No hx of HT and has been good . No excess  Etoh.   Past history family history social history reviewed in the electronic medical record. Hx of renal stones sees urolo;ist when needed    Objective:   Physical Exam [place] in [place]  Walks with a slight limp and tight right LB muscles  Abdomen:  Sof,t normal bowel sounds without hepatosplenomegaly, no guarding rebound or masses no CVA tendernessTender right si area  Right lateral hip trochanteric area  Goo rom hip pain with toe walk   Knee  rom  With crepitus but intact no effusion No groin mass . Neuro appears non focal and good le strength  Skin no acute rashes nl turgor.  CV s1 s2 no g or m repeat bp right arm  150/90 reg cuff      Assessment & Plan:  Right  LB and lateral hip pain with intermittent radiation  Seems MS in origin but has hx of renal stones Disc  Antiinflammatory rx   naproxyn bid for 10 days  Will need imaging but will plan on ortho consult and let them do that.  Ok to add on   Hydrocodone if needed in the meantime  Elevated BP readings poss from pain  Check readings at home and fu with pcp if  persistent or  progressive   Total visit > 50% spent counseling and coordinating care

## 2011-12-06 ENCOUNTER — Ambulatory Visit (INDEPENDENT_AMBULATORY_CARE_PROVIDER_SITE_OTHER): Payer: BC Managed Care – PPO | Admitting: Family Medicine

## 2011-12-06 ENCOUNTER — Encounter: Payer: Self-pay | Admitting: Family Medicine

## 2011-12-06 DIAGNOSIS — M6740 Ganglion, unspecified site: Secondary | ICD-10-CM

## 2011-12-06 DIAGNOSIS — M67479 Ganglion, unspecified ankle and foot: Secondary | ICD-10-CM

## 2011-12-06 NOTE — Patient Instructions (Signed)
Return when necessary 

## 2011-12-06 NOTE — Progress Notes (Signed)
  Subjective:    Patient ID: Andrew Mcmahon, male    DOB: 08-16-1951, 61 y.o.   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 The area was cleaned with Betadine 1% Xylocaine plain was used for anesthesia. A small incision was made the cyst was excised Band-Aid was applied he tolerated the procedure no complications   Review of Systems General and dermatologic review of systems otherwise negative    Objective:   Physical Exam  Procedure see above      Assessment & Plan:  Mucinous cyst excised

## 2012-02-05 ENCOUNTER — Ambulatory Visit (INDEPENDENT_AMBULATORY_CARE_PROVIDER_SITE_OTHER): Payer: BC Managed Care – PPO | Admitting: Family Medicine

## 2012-02-05 ENCOUNTER — Encounter: Payer: Self-pay | Admitting: Family Medicine

## 2012-02-05 VITALS — BP 110/78 | Temp 98.1°F | Wt 195.0 lb

## 2012-02-05 DIAGNOSIS — M6740 Ganglion, unspecified site: Secondary | ICD-10-CM

## 2012-02-05 DIAGNOSIS — M67479 Ganglion, unspecified ankle and foot: Secondary | ICD-10-CM

## 2012-02-05 DIAGNOSIS — H900 Conductive hearing loss, bilateral: Secondary | ICD-10-CM

## 2012-02-05 NOTE — Progress Notes (Signed)
  Subjective:    Patient ID: Andrew Mcmahon, male    DOB: Mar 20, 1951, 61 y.o.   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 It has since recurred. We discussed options including observation or seen Dr. Victorino Dike for excision  He also has bilateral hearing loss he's had bilateral cerumen impactions in the past   Review of Systems      general and dermatologic review of systems otherwise negative Objective:   Physical Exam Well-developed well-nourished male in no acute distress examination the ears shows bilateral cerumen impactions which were relieved by irrigation  Examination foot shows a dime-sized mucocele dorsum second toe left foot       Assessment & Plan:  Hearing loss secondary to cerumen impaction removed  Mucocele toe referred to Dr. Toni Arthurs

## 2012-02-05 NOTE — Patient Instructions (Signed)
If you would like another excision of the cyst on your toe I would recommend Dr. Toni Arthurs orthopedist  Return for your annual exam sooner if any problems

## 2012-05-28 ENCOUNTER — Ambulatory Visit (INDEPENDENT_AMBULATORY_CARE_PROVIDER_SITE_OTHER): Payer: BC Managed Care – PPO | Admitting: Family Medicine

## 2012-05-28 ENCOUNTER — Encounter: Payer: Self-pay | Admitting: Family Medicine

## 2012-05-28 VITALS — BP 120/80 | Wt 186.0 lb

## 2012-05-28 DIAGNOSIS — L259 Unspecified contact dermatitis, unspecified cause: Secondary | ICD-10-CM

## 2012-05-28 MED ORDER — TRIAMCINOLONE ACETONIDE 0.025 % EX OINT
TOPICAL_OINTMENT | Freq: Two times a day (BID) | CUTANEOUS | Status: DC
Start: 2012-05-28 — End: 2012-10-21

## 2012-05-28 NOTE — Progress Notes (Signed)
  Subjective:    Patient ID: Andrew Mcmahon, male    DOB: 02/16/1951, 61 y.o.   MRN: [id number]  HPI Andrew Mcmahon is a 61 year old male with a history of skin cancer on his face who has had a Mohs surgery in the past who comes in today for evaluation of 2 lesions  He has a red scaly lesion on the dorsum of his left wrist also a brown spot on his right hand   Review of Systems    general and dermatologic review of systems otherwise negative except for history of skin cancer Objective:   Physical Exam  Well-developed and nourished and in no acute distress the lesion on his left wrist dorsal area is dime-sized red scaly consistent with more of a contact dermatitis.  The lesion on his right hand is a solar spot      Assessment & Plan:  Contact dermatitis triamcinolone twice daily return if not improved  Solar dermatitis reassured to wear sunscreens

## 2012-05-28 NOTE — Patient Instructions (Signed)
Use the steroid ointment twice daily  If it does not heal after 3-4 weeks return for removal

## 2012-08-06 ENCOUNTER — Encounter: Payer: Self-pay | Admitting: Family Medicine

## 2012-08-06 ENCOUNTER — Ambulatory Visit (INDEPENDENT_AMBULATORY_CARE_PROVIDER_SITE_OTHER): Payer: BC Managed Care – PPO | Admitting: Family Medicine

## 2012-08-06 VITALS — BP 126/76 | HR 61 | Temp 97.6°F | Resp 17 | Wt 188.0 lb

## 2012-08-06 DIAGNOSIS — S60459A Superficial foreign body of unspecified finger, initial encounter: Secondary | ICD-10-CM

## 2012-08-06 NOTE — Progress Notes (Signed)
  Subjective:    Patient ID: Andrew Mcmahon, male    DOB: [birth year], 61 y.o.   MRN: [id number]  HPI Andrew Mcmahon is a 61 year old married male nonsmoker who comes in today for evaluation of possible for body in his right thumb  He has noticed a lump in his right thumbnail for couple weeks. Thinks it may be a splinter.     Review of Systems    dermatologic review of systems otherwise negative Objective:   Physical Exam Well-developed well-nourished male in no acute distress examination of the thumb shows a 4 mm x4 mm lesion with no obvious foreign body.  He was taken to the treatment room the area was cleaned with alcohol the top of the lesion was open but I cannot express any foreign body       Assessment & Plan:

## 2012-08-06 NOTE — Patient Instructions (Signed)
Call Dr. Molly Maduro sypher for consult

## 2012-08-07 NOTE — H&P (Signed)
  Patient is a 61 year old right-hand-dominant male who presented to our office on 08/07/2012 for evaluation and treatment of an apparent body of the right thumb pulp. The patient reported that he was unsure of when or what caused the particular problem. He noticed this about 2 weeks ago. He tried self removal and was unsuccessful. He proceeded to Dr. Trey Paula Todd's office on Tuesday, 08/06/2012. Dr. Tawanna Cooler attempt at removal of the foreign body, but was unsuccessful. The patient was then referred to our office for further evaluation and treatment.  Review of his past medical history reveals no known drug allergies. Review a 14 system. This was essentially negative except for corrective lenses. He uses Xanax on a as-needed basis. He is on no current blood, thinners. He's had no previous recent hospitalizations. Family history reveals no history of diabetes, or arthritis. There is a family history of heart disease and hypertension. Social history reveals that the patient does not smoke. He consumes a rare alcohol-based beverage. He is married and retired and resides in [place].   On examination his right thumb reveals an obvious foreign body in the pulp. Ulnar aspect. He has full motion of the thumb IP and MP joint. Neurovascularly, he is intact X-ray examination reveals no obvious foreign body.  Impression: Probable of retained wood foreign body ulnar aspect of right thumb pulp  Plan: Patient be taken to the operating room to undergo a excision of foreign body of right thumb pulp. The procedure, risks, benefits, and postoperative course were discussed with the patient at length and he was in agreement with this plan.  Marveen Reeks Dasnoit PA-C 08/08/12  H&P documentation: 08/08/2012  -History and Physical Reviewed  -Patient has been re-examined  -No change in the plan of care  Wyn Forster, MD

## 2012-08-08 ENCOUNTER — Encounter (HOSPITAL_BASED_OUTPATIENT_CLINIC_OR_DEPARTMENT_OTHER): Payer: Self-pay

## 2012-08-08 ENCOUNTER — Other Ambulatory Visit: Payer: Self-pay | Admitting: Orthopedic Surgery

## 2012-08-08 ENCOUNTER — Ambulatory Visit (HOSPITAL_BASED_OUTPATIENT_CLINIC_OR_DEPARTMENT_OTHER)
Admission: RE | Admit: 2012-08-08 | Discharge: 2012-08-08 | Disposition: A | Discharge status: Home / Self Care | Payer: BC Managed Care – PPO | Source: Ambulatory Visit | Attending: Orthopedic Surgery | Admitting: Orthopedic Surgery

## 2012-08-08 ENCOUNTER — Encounter (HOSPITAL_BASED_OUTPATIENT_CLINIC_OR_DEPARTMENT_OTHER)
Admission: RE | Disposition: A | Discharge status: Home / Self Care | Payer: Self-pay | Source: Ambulatory Visit | Attending: Orthopedic Surgery

## 2012-08-08 DIAGNOSIS — Z1881 Retained glass fragments: Secondary | ICD-10-CM | POA: Insufficient documentation

## 2012-08-08 DIAGNOSIS — M795 Residual foreign body in soft tissue: Secondary | ICD-10-CM | POA: Insufficient documentation

## 2012-08-08 HISTORY — PX: MASS EXCISION: SHX2000

## 2012-08-08 SURGERY — MINOR EXCISION OF MASS
Anesthesia: LOCAL | Site: Thumb | Laterality: Right | Wound class: Clean

## 2012-08-08 MED ORDER — TRAMADOL HCL 50 MG PO TABS: ORAL_TABLET | ORAL | Status: DC

## 2012-08-08 MED ORDER — LIDOCAINE HCL 2 % IJ SOLN
INTRAMUSCULAR | Status: DC | PRN
  Administered 2012-08-08: 3 mL

## 2012-08-08 SURGICAL SUPPLY — 47 items
BANDAGE ADHESIVE 1X3 (GAUZE/BANDAGES/DRESSINGS) IMPLANT
BANDAGE COBAN STERILE 2 (GAUZE/BANDAGES/DRESSINGS) IMPLANT
BLADE MINI RND TIP GREEN BEAV (BLADE) IMPLANT
BLADE SURG 15 STRL LF DISP TIS (BLADE) ×1 IMPLANT
BLADE SURG 15 STRL SS (BLADE) ×2
BNDG CMPR 9X4 STRL LF SNTH (GAUZE/BANDAGES/DRESSINGS)
BNDG COHESIVE 1X5 TAN STRL LF (GAUZE/BANDAGES/DRESSINGS) ×2 IMPLANT
BNDG ESMARK 4X9 LF (GAUZE/BANDAGES/DRESSINGS) IMPLANT
BRUSH SCRUB EZ PLAIN DRY (MISCELLANEOUS) ×2 IMPLANT
CLOTH BEACON ORANGE TIMEOUT ST (SAFETY) ×2 IMPLANT
CORDS BIPOLAR (ELECTRODE) IMPLANT
COVER MAYO STAND STRL (DRAPES) ×2 IMPLANT
COVER TABLE BACK 60X90 (DRAPES) IMPLANT
CUFF TOURNIQUET SINGLE 18IN (TOURNIQUET CUFF) IMPLANT
DECANTER SPIKE VIAL GLASS SM (MISCELLANEOUS) IMPLANT
DRAIN PENROSE 1/2X12 LTX STRL (WOUND CARE) ×2 IMPLANT
DRAIN PENROSE 1/4X12 LTX STRL (WOUND CARE) IMPLANT
DRAPE EXTREMITY T 121X128X90 (DRAPE) IMPLANT
DRAPE SURG 17X23 STRL (DRAPES) ×2 IMPLANT
GAUZE XEROFORM 1X8 LF (GAUZE/BANDAGES/DRESSINGS) IMPLANT
GLOVE BIOGEL M STRL SZ7.5 (GLOVE) ×2 IMPLANT
GLOVE EXAM NITRILE EXT CUFF MD (GLOVE) ×2 IMPLANT
GLOVE ORTHO TXT STRL SZ7.5 (GLOVE) ×2 IMPLANT
GOWN BRE IMP PREV XXLGXLNG (GOWN DISPOSABLE) ×4 IMPLANT
GOWN PREVENTION PLUS XLARGE (GOWN DISPOSABLE) IMPLANT
NDL SAFETY ECLIPSE 18X1.5 (NEEDLE) ×1 IMPLANT
NEEDLE 27GAX1X1/2 (NEEDLE) ×2 IMPLANT
NEEDLE BLUNT 17GA (NEEDLE) IMPLANT
NEEDLE HYPO 18GX1.5 SHARP (NEEDLE) ×2
NS IRRIG 1000ML POUR BTL (IV SOLUTION) IMPLANT
PACK BASIN DAY SURGERY FS (CUSTOM PROCEDURE TRAY) ×2 IMPLANT
PADDING CAST ABS 4INX4YD NS (CAST SUPPLIES)
PADDING CAST ABS COTTON 4X4 ST (CAST SUPPLIES) IMPLANT
SPONGE GAUZE 4X4 12PLY (GAUZE/BANDAGES/DRESSINGS) ×2 IMPLANT
STOCKINETTE 4X48 STRL (DRAPES) ×2 IMPLANT
SUT ETHILON 5 0 P 3 18 (SUTURE)
SUT MERSILENE 4 0 P 3 (SUTURE) IMPLANT
SUT NYLON ETHILON 5-0 P-3 1X18 (SUTURE) IMPLANT
SUT PROLENE 3 0 PS 2 (SUTURE) IMPLANT
SUT PROLENE 4 0 P 3 18 (SUTURE) ×2 IMPLANT
SYR 20CC LL (SYRINGE) IMPLANT
SYR 3ML 23GX1 SAFETY (SYRINGE) IMPLANT
SYR CONTROL 10ML LL (SYRINGE) ×2 IMPLANT
TOWEL OR 17X24 6PK STRL BLUE (TOWEL DISPOSABLE) ×2 IMPLANT
TRAY DSU PREP LF (CUSTOM PROCEDURE TRAY) ×2 IMPLANT
UNDERPAD 30X30 INCONTINENT (UNDERPADS AND DIAPERS) ×2 IMPLANT
WATER STERILE IRR 1000ML POUR (IV SOLUTION) IMPLANT

## 2012-08-08 NOTE — Op Note (Signed)
[id number] 

## 2012-08-08 NOTE — Brief Op Note (Signed)
08/08/2012  10:14 AM  PATIENT:  Andrew Mcmahon  61 y.o. male  PRE-OPERATIVE DIAGNOSIS:  foreign body right thumb ulnar pulp  POST-OPERATIVE DIAGNOSIS:  Silica type foreign bodies right thumb pulp with severe fibrotic encapsulation  PROCEDURE:  Procedure(s) (LRB) with comments: FOREIGN BODY REMOVAL ADULT (Right)  SURGEON:  Surgeon(s) and Role:    * Wyn Forster., MD - Primary  PHYSICIAN ASSISTANT:   ASSISTANTS: Mallory Shirk.A-C   ANESTHESIA:   2% lidocaine metacarpal head level digital block  EBL:     BLOOD ADMINISTERED:none  DRAINS: none   LOCAL MEDICATIONS USED:  LIDOCAINE   SPECIMEN:  No Specimen  DISPOSITION OF SPECIMEN:  N/A  COUNTS:  YES  TOURNIQUET:  * No tourniquets in log *  DICTATION: .Other Dictation: Dictation Number [id number]  PLAN OF CARE: Discharge to home after PACU  PATIENT DISPOSITION:  PACU - hemodynamically stable.

## 2012-08-09 ENCOUNTER — Encounter (HOSPITAL_BASED_OUTPATIENT_CLINIC_OR_DEPARTMENT_OTHER): Payer: Self-pay | Admitting: Orthopedic Surgery

## 2012-08-09 NOTE — Op Note (Signed)
Andrew Mcmahon, Andrew Mcmahon NO.:  [id number]  MEDICAL RECORD NO.:  [id number]  LOCATION:                                 FACILITY:  PHYSICIAN:  Andrew Mcmahon, M.D.      DATE OF BIRTH:  DATE OF PROCEDURE:  08/08/2012 DATE OF DISCHARGE:                              OPERATIVE REPORT   PREOPERATIVE DIAGNOSES:  Mass, right thumb ulnar pulp, probable foreign body, status post unsuccessful attempted removal in primary care office.  POSTOPERATIVE DIAGNOSES:  Silica based foreign bodies, right thumb ulnar pulp with marked fibrotic scar response creating subdermal mass.  OPERATION:  Resection of subdermal mass with identification of at least 3 silica type small foreign bodies that were angular and consistent with either ceramic or some type of glass.  Operation is foreign body and scar mass removal, right thumb ulnar pulp.  OPERATING SURGEON:  Andrew Fitch. Armour Villanueva, MD  ASSISTANT:  Andrew Reeks Dasnoit, PA-C  ANESTHESIA:  Lidocaine 2% metacarpal head level block of right thumb. This was performed as a minor operating room procedure.  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  Andrew Mcmahon has been retired for about 18 months and has been very active, playing golf and gardening.  Andrew Mcmahon could not recall any injury from wood splinter, metal, ceramic, or glass but during the past several months noted an enlarging and painful mass on the ulnar pulp of his right thumb.  Andrew Mcmahon presented for evaluation by his primary care physician and under local anesthetic in the office had a small incision made. This attempt was unsuccessful in removing the mass and/or foreign body. Andrew Mcmahon was then referred for a semi-urgent hand surgery consult.  Andrew Mcmahon was seen in the office and noted to have a mass measuring about 8 mm in length, 4 mm in width, and 4 mm in  depth, consistent with scar and/or foreign body granuloma.  We had a detailed informed consent during which we pointed out to him that the removal of foreign bodies from the hand is susceptibly difficult.  First of all, the pulp of the thumb and fingers is incredibly vascular and bleeds profusely with any incision.  Second there is often a scar response causing adherence of the granuloma and/or foreign body reaction to adjacent nerves and/or distal arterial branches.  Removal of these foreign bodies if not performed with magnification under tourniquet hemostasis can be quite problematic leading to long-term pain and/or numbness.  After explaining the rationale for using a proper operating room under local anesthetic conditions, Andrew Mcmahon is brought to the [facility] at this time.  Preoperatively questions were invited and answered in detail.  PROCEDURE:  Andrew Mcmahon was interviewed in the holding area and his proper surgical site identified per protocol with a marker.  Andrew Mcmahon was then walked to room 1 and placed in supine position on the operating table and his right arm on an armboard.  After alcohol Betadine prep, his thumb was anesthetized with 3 mL of 2% lidocaine at metacarpal  head level.  After 10 minutes, excellent anesthesia was achieved.  The hand and arm were thoroughly prepped with Betadine soap and solution, sterilely draped.  Following routine surgical time-out, the thumb was exsanguinated with a gauze wrap and a half-inch Penrose drain was placed at the base of the thumb as a digital tourniquet.  We carefully palpated the mass and after assuring that anesthesia was complete, we performed an elliptical excision of the prior incision created at the primary care office.  Subcutaneous tissues were remarkably dense with a very thick fibrous response consistent with a silica type scar response.  We then performed dissection around the mass and removed most of the  mass as a single block.  We then opened the mass and identified at least 2 angular foreign bodies that looked to be shards of glass or ceramic.  These would click with the contact of a Therapist, nutritional.  There were some other areas of subdermal scarring.  These were meticulously removed with scalpel dissection.  I then palpated the wound very thoroughly with a standard Andrew Mcmahon and a [employer] and could not identify any other palpable masses.  I carefully inspected the subcutaneous fat and the distal branches of the ulnar proper digital nerve and artery, taking care to avoid injury.  The wound was then closed with 2 simple sutures of 4-0 Prolene.  For aftercare, Andrew Mcmahon was placed in dressing of Xeroflo, sterile gauze, and Coban.  Andrew Mcmahon is advised to keep his hand clean and dry for the next week.  Andrew Mcmahon will return in 1 week for suture removal.  Andrew Mcmahon requested permission to play golf within 3 days.  I told him I thought that this was not a good idea and would not be sanctioned.  I also advised him that if Andrew Mcmahon wanted to swim as this is his habit, Andrew Mcmahon should remove the dressing after 4 days, swim in chlorinated water and then redress the thumb after completing his exercise with a dry dressing.     Andrew Fitch Courtenay Hirth, M.D.     RVS/MEDQ  D:  08/08/2012  T:  08/09/2012  Job:  [id number]  cc:   Andrew Gens A. Tawanna Cooler, MD

## 2012-09-19 ENCOUNTER — Encounter: Payer: Self-pay | Admitting: Family Medicine

## 2012-09-19 ENCOUNTER — Ambulatory Visit (INDEPENDENT_AMBULATORY_CARE_PROVIDER_SITE_OTHER): Payer: BC Managed Care – PPO | Admitting: Family Medicine

## 2012-09-19 VITALS — BP 124/84 | Temp 98.2°F | Wt 186.0 lb

## 2012-09-19 DIAGNOSIS — H6120 Impacted cerumen, unspecified ear: Secondary | ICD-10-CM

## 2012-09-19 DIAGNOSIS — H918X9 Other specified hearing loss, unspecified ear: Secondary | ICD-10-CM

## 2012-09-19 NOTE — Progress Notes (Signed)
  Subjective:    Patient ID: Andrew Mcmahon, male    DOB: December 10, 1950, 61 y.o.   MRN: [id number]  HPI Gamble is a 61 year old male married nonsmoker who comes in today for evaluation of hearing loss  He is a history of recurrent cerumen impactions   Review of Systems    review of systems negative Objective:   Physical Exam HEENT pertinent he had bilateral cerumen impactions which were relieved by irrigation       Assessment & Plan:  Cerumen impaction was relieved by irrigation

## 2012-10-20 ENCOUNTER — Encounter (HOSPITAL_COMMUNITY): Payer: Self-pay | Admitting: *Deleted

## 2012-10-20 ENCOUNTER — Emergency Department (HOSPITAL_COMMUNITY)
Admission: EM | Admit: 2012-10-20 | Discharge: 2012-10-20 | Disposition: A | Discharge status: Home / Self Care | Payer: BC Managed Care – PPO | Attending: Emergency Medicine | Admitting: Emergency Medicine

## 2012-10-20 ENCOUNTER — Encounter (HOSPITAL_COMMUNITY): Payer: Self-pay | Admitting: Emergency Medicine

## 2012-10-20 ENCOUNTER — Emergency Department (HOSPITAL_COMMUNITY)
Admission: EM | Admit: 2012-10-20 | Discharge: 2012-10-21 | Disposition: A | Discharge status: Home / Self Care | Payer: BC Managed Care – PPO | Attending: Emergency Medicine | Admitting: Emergency Medicine

## 2012-10-20 DIAGNOSIS — Z85828 Personal history of other malignant neoplasm of skin: Secondary | ICD-10-CM | POA: Insufficient documentation

## 2012-10-20 DIAGNOSIS — Z7982 Long term (current) use of aspirin: Secondary | ICD-10-CM | POA: Insufficient documentation

## 2012-10-20 DIAGNOSIS — R109 Unspecified abdominal pain: Secondary | ICD-10-CM | POA: Insufficient documentation

## 2012-10-20 DIAGNOSIS — F410 Panic disorder [episodic paroxysmal anxiety] without agoraphobia: Secondary | ICD-10-CM | POA: Insufficient documentation

## 2012-10-20 DIAGNOSIS — Z79899 Other long term (current) drug therapy: Secondary | ICD-10-CM | POA: Insufficient documentation

## 2012-10-20 DIAGNOSIS — Z9889 Other specified postprocedural states: Secondary | ICD-10-CM | POA: Insufficient documentation

## 2012-10-20 DIAGNOSIS — R319 Hematuria, unspecified: Secondary | ICD-10-CM | POA: Insufficient documentation

## 2012-10-20 DIAGNOSIS — R112 Nausea with vomiting, unspecified: Secondary | ICD-10-CM | POA: Insufficient documentation

## 2012-10-20 DIAGNOSIS — Z87442 Personal history of urinary calculi: Secondary | ICD-10-CM | POA: Insufficient documentation

## 2012-10-20 DIAGNOSIS — N200 Calculus of kidney: Secondary | ICD-10-CM | POA: Insufficient documentation

## 2012-10-20 DIAGNOSIS — Z8739 Personal history of other diseases of the musculoskeletal system and connective tissue: Secondary | ICD-10-CM | POA: Insufficient documentation

## 2012-10-20 DIAGNOSIS — R300 Dysuria: Secondary | ICD-10-CM | POA: Insufficient documentation

## 2012-10-20 LAB — URINALYSIS, MICROSCOPIC ONLY
Bilirubin Urine: NEGATIVE
Glucose, UA: NEGATIVE mg/dL
Protein, ur: NEGATIVE mg/dL
Specific Gravity, Urine: 1.02 (ref 1.005–1.030)

## 2012-10-20 LAB — POCT I-STAT, CHEM 8
BUN: 16 mg/dL (ref 6–23)
Chloride: 103 mEq/L (ref 96–112)
HCT: 49 % (ref 39.0–52.0)
Potassium: 4.2 mEq/L (ref 3.5–5.1)

## 2012-10-20 MED ORDER — ONDANSETRON HCL 4 MG/2ML IJ SOLN
4.0000 mg | Freq: Once | INTRAMUSCULAR | Status: DC
  Filled 2012-10-20: qty 2

## 2012-10-20 MED ORDER — SODIUM CHLORIDE 0.9 % IV SOLN: 1000.0000 mL | Freq: Once | INTRAVENOUS | Status: DC

## 2012-10-20 MED ORDER — FENTANYL CITRATE 0.05 MG/ML IJ SOLN
50.0000 ug | Freq: Once | INTRAMUSCULAR | Status: DC
  Filled 2012-10-20: qty 2

## 2012-10-20 NOTE — ED Provider Notes (Signed)
Medical screening examination/treatment/procedure(s) were performed by non-physician practitioner and as supervising physician I was immediately available for consultation/collaboration.  Doug Sou, MD 10/20/12 [phone]

## 2012-10-20 NOTE — ED Provider Notes (Signed)
History     CSN: [id number]  Arrival date & time 10/20/12  1344   First MD Initiated Contact with Patient 10/20/12 1419      Chief Complaint  Patient presents with  . Flank Pain    (Consider location/radiation/quality/duration/timing/severity/associated sxs/prior treatment) HPI Comments: Andrew Mcmahon is a 61 y.o. male with history of kidney stones presents emergency department with chief complaint of left-sided flank pain with radiation to groin.  Symptoms resolved once patient was evaluated.  He states that this seems very similar to past kidney stones.  He denies any current pain, dysuria, hematuria, fevers night sweats or chills.  The history is provided by the patient.    Past Medical History  Diagnosis Date  . Cancer     skin  . Back pain     low  . Panic attack   . Kidney stones     history    Past Surgical History  Procedure Date  . Nasal septum surgery   . Colonoscopy   . Mohs surgery   . Mass excision 08/08/2012    Procedure: MINOR EXCISION OF MASS;  Surgeon: Wyn Forster., MD;  Location: [facility];  Service: Orthopedics;  Laterality: Right;  Minor Excision of Foreign Body    Family History  Problem Relation Age of Onset  . Liver disease Other   . Cancer Father     multiple myeloma, melanoma    History  Substance Use Topics  . Smoking status: Never Smoker   . Smokeless tobacco: Never Used  . Alcohol Use: Yes     Comment: daily      Review of Systems  Constitutional: Negative for fever, chills, diaphoresis and fatigue.  HENT: Negative for ear pain and sinus pressure.   Eyes: Negative for visual disturbance.  Respiratory: Negative for cough.   Cardiovascular: Negative for chest pain.  Gastrointestinal: Negative for nausea, vomiting and abdominal pain.  Genitourinary: Positive for flank pain (resolved) and difficulty urinating. Negative for dysuria and urgency.  Skin: Negative for color change and rash.  Neurological:  Negative for dizziness and headaches.  Psychiatric/Behavioral: Negative for confusion.  All other systems reviewed and are negative.    Allergies  Review of patient's allergies indicates no known allergies.  Home Medications   Current Outpatient Rx  Name  Route  Sig  Dispense  Refill  . ALPRAZOLAM 0.5 MG PO TABS   Oral   Take 1 tablet (0.5 mg total) by mouth at bedtime as needed.   30 tablet   1   . ASPIRIN 81 MG PO TABS   Oral   Take 81 mg by mouth daily.           [name] Kitchen VITAMIN D-3 PO   Oral   Take by mouth.         . OMEGA-3 FATTY ACIDS 1000 MG PO CAPS   Oral   Take 2 g by mouth daily.           [name] Kitchen PROBIOTIC PO   Oral   Take by mouth daily.          . TRIAMCINOLONE ACETONIDE 0.025 % EX OINT   Topical   Apply topically 2 (two) times daily.   30 g   2     BP 161/86  Pulse 56  Temp 98.5 F (36.9 C) (Oral)  Resp 14  SpO2 100%  Physical Exam  Nursing note and vitals reviewed. Constitutional: He is oriented to person, place,  and time. He appears well-developed and well-nourished. No distress.  HENT:  Head: Normocephalic and atraumatic.  Eyes: Conjunctivae normal and EOM are normal.  Neck: Normal range of motion.  Cardiovascular: Normal rate, regular rhythm and normal heart sounds.   Pulmonary/Chest: Effort normal.  Abdominal:       Abdomen soft nontender with normal bowel sounds.  Genitourinary:       No CVA tenderness.  Musculoskeletal: Normal range of motion.  Neurological: He is alert and oriented to person, place, and time.       No difficulty ambulating, normal gait.  Strength 5/5 bilaterally  Skin: Skin is warm and dry. No rash noted. He is not diaphoretic.  Psychiatric: He has a normal mood and affect. His behavior is normal.    ED Course  Procedures (including critical care time)  Labs Reviewed  URINALYSIS, MICROSCOPIC ONLY - Abnormal; Notable for the following:    APPearance CLOUDY (*)     Hgb urine dipstick LARGE (*)     Bacteria,  UA FEW (*)     All other components within normal limits  POCT I-STAT, CHEM 8 - Abnormal; Notable for the following:    Glucose, Bld 100 (*)     All other components within normal limits   No results found.   1. Hematuria   2. Flank pain, acute       MDM  Patient with a history of kidney stones presents emergency department with resolved left-sided flank pain.  Patient states that the pain is very similar to when he is passed kidney stones in the past.  He denied any gross hematuria or fevers.  Patient's urine and kidney functions checked.  Microscopic hematuria seen.  Discussed results with patient who will followup with his primary care physician.  Strict return precautions discussed if gross hematuria develops or difficulty urinating.  At time of discharge patient is pain-free in no acute distress with normal vital signs.BP 161/86  Pulse 56  Temp 98.5 F (36.9 C) (Oral)  Resp 14  SpO2 100%         Jaci Carrel, PA-C 10/20/12 1625

## 2012-10-20 NOTE — ED Notes (Signed)
Pt reports that he was seen this afternoon and was diagnosed with kidney stones; pt's pain had improved and pt states "they thought I had passed it"; pt states that after he went home around 7:30pm he had sudden onset of pain again; vomiting x 2; sweating and chills; pt took some home pain medications and pain tolerable at present but pt is concerned that pain will return.

## 2012-10-20 NOTE — ED Notes (Signed)
Pt presenting to ed with c/o intermittent left side flank pain onset this morinin. Pt states he has history of kidney stones. Pt denies hematuria at this time. Pt denies nausea and vomiting at this time

## 2012-10-20 NOTE — ED Notes (Signed)
Pt reports hx of kidney stones. [facility]. Denies hematuria. States urologist gave rx for pain medication, which was at home, so [facility]. States pain has subsided at this time, does not wish to have IV.

## 2012-10-21 ENCOUNTER — Emergency Department (HOSPITAL_COMMUNITY): Payer: BC Managed Care – PPO

## 2012-10-21 MED ORDER — OXYCODONE-ACETAMINOPHEN 5-325 MG PO TABS: 2.0000 | ORAL_TABLET | ORAL | Status: DC | PRN

## 2012-10-21 MED ORDER — TAMSULOSIN HCL 0.4 MG PO CAPS: 0.4000 mg | ORAL_CAPSULE | Freq: Every day | ORAL | Status: DC

## 2012-10-21 MED ORDER — ONDANSETRON 8 MG PO TBDP
8.0000 mg | ORAL_TABLET | Freq: Once | ORAL | Status: AC
  Administered 2012-10-21: 8 mg via ORAL
  Filled 2012-10-21: qty 1

## 2012-10-21 MED ORDER — ONDANSETRON 8 MG PO TBDP: ORAL_TABLET | ORAL | Status: DC

## 2012-10-21 MED ORDER — OXYCODONE-ACETAMINOPHEN 5-325 MG PO TABS
1.0000 | ORAL_TABLET | Freq: Once | ORAL | Status: AC
  Administered 2012-10-21: 1 via ORAL
  Filled 2012-10-21: qty 1

## 2012-10-21 MED ORDER — KETOROLAC TROMETHAMINE 60 MG/2ML IM SOLN
60.0000 mg | Freq: Once | INTRAMUSCULAR | Status: AC
  Administered 2012-10-21: 60 mg via INTRAMUSCULAR
  Filled 2012-10-21: qty 2

## 2012-10-21 NOTE — ED Provider Notes (Signed)
Medical screening examination/treatment/procedure(s) were performed by non-physician practitioner and as supervising physician I was immediately available for consultation/collaboration.  Galdino Hinchman M Maximillion Gill, MD 10/21/12 0403 

## 2012-10-21 NOTE — ED Provider Notes (Signed)
History     CSN: [id number]  Arrival date & time 10/20/12  2308   First MD Initiated Contact with Patient 10/20/12 2344      Chief Complaint  Patient presents with  . Nephrolithiasis   HPI  History provided by the patient and recent medical chart. Patient is a 61 year old male with past history of multiple kidney stones who returns to emergency room with complaints of significant left flank pain with nausea vomiting episodes. Patient reports being evaluated in the emergency room earlier today for similar symptoms. He reports that time he was having some aches and pains around her left flank similar to previous kidney stones. Patient states symptoms are not that severe and were waxing and waning. Patient had evaluation with urine test and blood tests but reported feeling much better without interventions in the emergency room and was discharged in stable condition with a presumptive diagnosis of possible kidney stone. Patient reports that later this evening after returning from dinner he suddenly had sharp left flank pain. Pain was 9/10 at its worst and associated multiple sews of nausea vomiting. Patient reports being called up on the floor due to the pain. He was able to take 1 dose of hydrocodone from an old prescription one year ago. He states he is unsure if this helped much with the pain because he did have vomiting shortly after taking this. Currently he reports feeling comfortable with pains around 5/10 and bearable. He denies having any fevers. Did have some chills and sweats associated with vomiting episodes but currently without complaints.    Past Medical History  Diagnosis Date  . Cancer     skin  . Back pain     low  . Panic attack   . Kidney stones     history    Past Surgical History  Procedure Date  . Nasal septum surgery   . Colonoscopy   . Mohs surgery   . Mass excision 08/08/2012    Procedure: MINOR EXCISION OF MASS;  Surgeon: Wyn Forster., MD;  Location:  [facility];  Service: Orthopedics;  Laterality: Right;  Minor Excision of Foreign Body    Family History  Problem Relation Age of Onset  . Liver disease Other   . Cancer Father     multiple myeloma, melanoma    History  Substance Use Topics  . Smoking status: Never Smoker   . Smokeless tobacco: Never Used  . Alcohol Use: Yes     Comment: daily      Review of Systems  Constitutional: Negative for fever.  Respiratory: Negative for shortness of breath.   Cardiovascular: Negative for chest pain.  Gastrointestinal: Positive for nausea and vomiting. Negative for abdominal pain, diarrhea and constipation.  Genitourinary: Positive for flank pain. Negative for dysuria, frequency and hematuria.  All other systems reviewed and are negative.    Allergies  Review of patient's allergies indicates no known allergies.  Home Medications   Current Outpatient Rx  Name  Route  Sig  Dispense  Refill  . ALPRAZOLAM 0.5 MG PO TABS   Oral   Take 0.5 mg by mouth at bedtime as needed.         . ASPIRIN EC 81 MG PO TBEC   Oral   Take 81 mg by mouth daily.         [name] Kitchen VITAMIN D-3 PO   Oral   Take 1,200 Units by mouth daily.          [name] Kitchen  OMEGA-3 FATTY ACIDS 1000 MG PO CAPS   Oral   Take 2 g by mouth daily.           [name] Kitchen HYDROCODONE-ACETAMINOPHEN 10-325 MG PO TABS   Oral   Take 1 tablet by mouth every 6 (six) hours as needed.         [name] Kitchen PROBIOTIC PO   Oral   Take by mouth daily. OTC   Does not know dosage         . TRIAMCINOLONE ACETONIDE 0.025 % EX OINT   Topical   Apply 1 application topically 2 (two) times daily.           BP 179/83  Pulse 64  Temp 97.7 F (36.5 C) (Oral)  Resp 20  SpO2 100%  Physical Exam  Nursing note and vitals reviewed. Constitutional: He is oriented to person, place, and time. He appears well-developed and well-nourished. No distress.  HENT:  Head: Normocephalic.  Cardiovascular: Normal rate and regular rhythm.   No  murmur heard. Pulmonary/Chest: Effort normal and breath sounds normal. No respiratory distress. He has no wheezes. He has no rales.  Abdominal: Soft. He exhibits no distension. There is no tenderness. There is no rebound and no guarding.       Left CVA tenderness  Neurological: He is alert and oriented to person, place, and time.  Skin: Skin is warm.  Psychiatric: He has a normal mood and affect. His behavior is normal.    ED Course  Procedures   Results for orders placed during the hospital encounter of 10/20/12  URINALYSIS, MICROSCOPIC ONLY      Component Value Range   Color, Urine YELLOW  YELLOW   APPearance CLOUDY (*) CLEAR   Specific Gravity, Urine 1.020  1.005 - 1.030   pH 5.5  5.0 - 8.0   Glucose, UA NEGATIVE  NEGATIVE mg/dL   Hgb urine dipstick LARGE (*) NEGATIVE   Bilirubin Urine NEGATIVE  NEGATIVE   Ketones, ur NEGATIVE  NEGATIVE mg/dL   Protein, ur NEGATIVE  NEGATIVE mg/dL   Urobilinogen, UA 0.2  0.0 - 1.0 mg/dL   Nitrite NEGATIVE  NEGATIVE   Leukocytes, UA NEGATIVE  NEGATIVE   RBC / HPF 11-20  <3 RBC/hpf   Bacteria, UA FEW (*) RARE  POCT I-STAT, CHEM 8      Component Value Range   Sodium 139  135 - 145 mEq/L   Potassium 4.2  3.5 - 5.1 mEq/L   Chloride 103  96 - 112 mEq/L   BUN 16  6 - 23 mg/dL   Creatinine, Ser 1.61  0.50 - 1.35 mg/dL   Glucose, Bld 096 (*) 70 - 99 mg/dL   Calcium, Ion 0.45  4.09 - 1.30 mmol/L   TCO2 27  0 - 100 mmol/L   Hemoglobin 16.7  13.0 - 17.0 g/dL   HCT 81.1  91.4 - 78.2 %   Dg Abd 1 View  10/21/2012  *RADIOLOGY REPORT*  Clinical Data: Left flank pain.  History of urinary tract stone.  ABDOMEN - 1 VIEW  Comparison: CT abdomen pelvis 10/25/2010.  Findings: No evidence of urinary tract stone is seen.  Bowel gas pattern is unremarkable.  No focal bony abnormality.  IMPRESSION: Negative study.   Original Report Authenticated By: Holley Dexter, M.D.          1. Flank pain   2. Nausea & vomiting   3. Kidney stone on left side        MDM  12:20 AM patient seen and evaluated. Patient currently looks well in no significant discomfort or distress resting in bed.  Patient beginning to have some return of pains left flank area. Pain medications ordered. I discussed evaluation options with patient and at this time we will proceed with a plain KUB to evaluate for possible stone.  KUB unremarkable without definite signs of stone. No other concerning findings. Patient currently reports feeling great in no significant discomfort or pain after seating medications. We've discussed in depth further evaluation of symptoms including a CAT scan. At this time patient refuses CAT scan and wishes to return home and followup with a urologist in the office. Plan to give prescriptions for pain medications, Zofran and Flomax.        Angus Seller, [place] 10/21/12 [phone]

## 2012-12-16 ENCOUNTER — Encounter: Payer: Self-pay | Admitting: Family Medicine

## 2012-12-16 ENCOUNTER — Telehealth: Payer: Self-pay | Admitting: Family Medicine

## 2012-12-16 ENCOUNTER — Ambulatory Visit (INDEPENDENT_AMBULATORY_CARE_PROVIDER_SITE_OTHER): Payer: BC Managed Care – PPO | Admitting: Family Medicine

## 2012-12-16 VITALS — BP 130/80 | Temp 98.4°F | Wt 192.0 lb

## 2012-12-16 DIAGNOSIS — J029 Acute pharyngitis, unspecified: Secondary | ICD-10-CM

## 2012-12-16 NOTE — Progress Notes (Signed)
  Subjective:    Patient ID: Andrew Mcmahon, male    DOB: 04-12-1951, 62 y.o.   MRN: [id number]  HPI Andrew Mcmahon is a 62 year old male who comes in today for a right-sided sore throat  He noticed a day or so ago severe pain in the right side of his throat over the last 24 hours the pain has dissipated. No fever etc.   Review of Systems Review of systems negative    Objective:   Physical Exam Well-developed well-nourished male in no acute distress examination oral cavity is normal except it appears there may been some food stuck in that right tonsil       Assessment & Plan:  Acute pharyngitis secondary to food stuck in the right tonsil garble return when necessary

## 2012-12-16 NOTE — Patient Instructions (Signed)
Gargle with warm salt water 3 times daily return when necessary

## 2012-12-16 NOTE — Telephone Encounter (Signed)
Pt aware/kjh 

## 2012-12-16 NOTE — Telephone Encounter (Signed)
Okay to work in at the end of the day 

## 2012-12-16 NOTE — Telephone Encounter (Signed)
Pt has sore throat. When he swallows it hurts  on the right side and goes all the way down to upper chest. Pt was hoping to come in today.  No fever. Pls advise.

## 2013-06-20 ENCOUNTER — Ambulatory Visit (INDEPENDENT_AMBULATORY_CARE_PROVIDER_SITE_OTHER): Payer: BC Managed Care – PPO | Admitting: Family

## 2013-06-20 ENCOUNTER — Encounter: Payer: Self-pay | Admitting: Family

## 2013-06-20 VITALS — BP 120/86 | HR 66 | Wt 190.0 lb

## 2013-06-20 DIAGNOSIS — D489 Neoplasm of uncertain behavior, unspecified: Secondary | ICD-10-CM

## 2013-06-20 DIAGNOSIS — M25579 Pain in unspecified ankle and joints of unspecified foot: Secondary | ICD-10-CM

## 2013-06-20 DIAGNOSIS — M79609 Pain in unspecified limb: Secondary | ICD-10-CM

## 2013-06-20 DIAGNOSIS — M25572 Pain in left ankle and joints of left foot: Secondary | ICD-10-CM

## 2013-06-20 DIAGNOSIS — M79674 Pain in right toe(s): Secondary | ICD-10-CM

## 2013-06-20 LAB — URIC ACID: Uric Acid, Serum: 5.6 mg/dL (ref 4.0–7.8)

## 2013-06-20 MED ORDER — METHYLPREDNISOLONE 4 MG PO KIT: PACK | ORAL | Status: AC

## 2013-06-20 NOTE — Progress Notes (Signed)
Subjective:    Patient ID: Andrew Mcmahon, male    DOB: 09-25-1951, 62 y.o.   MRN: [id number]  HPI 62 year old white male, nonsmoker, patient of Dr. Tawanna Cooler is in today with concerns of a lesion to his left wrist that's been present for one year. Dr. Tawanna Cooler prescribed triamcinolone cream for which she's been applying to that area twice a day for a tender lesion has not resolved.   Has concerns of left ankle pain, rates a 3/10, worse with walking. Has been applying an ankle support that helps his symptoms. Reports rolling his ankle over 3 months ago and became swollen. The swelling resolved but the pain never completely went away. Denies any previous history of injury to the ankle  Patient has concerns her right great toe pain x2 days. Reports the joint at the right great toe being red, swollen and tender to touch. Reports he just came back from vacation where he had an increase in alcohol consumption.    Review of Systems  Constitutional: Negative.   Respiratory: Negative.   Cardiovascular: Negative.   Gastrointestinal: Negative.   Genitourinary: Negative.   Musculoskeletal: Positive for arthralgias.       Left ankle pain. Right great toe pain  Skin: Positive for color change.       Skin lesion to the left wrist  Allergic/Immunologic: Negative.   Neurological: Negative.   Psychiatric/Behavioral: Negative.    Past Medical History  Diagnosis Date  . Cancer     skin  . Back pain     low  . Panic attack   . Kidney stones     history    History   Social History  . Marital Status: Married    Spouse Name: N/A    Number of Children: 0  . Years of Education: N/A   Occupational History  . consultant    Social History Main Topics  . Smoking status: Never Smoker   . Smokeless tobacco: Never Used  . Alcohol Use: Yes     Comment: daily  . Drug Use: No  . Sexual Activity: Not on file   Other Topics Concern  . Not on file   Social History Narrative  . No narrative on file     Past Surgical History  Procedure Laterality Date  . Nasal septum surgery    . Colonoscopy    . Mohs surgery    . Mass excision  08/08/2012    Procedure: MINOR EXCISION OF MASS;  Surgeon: Wyn Forster., MD;  Location: [facility];  Service: Orthopedics;  Laterality: Right;  Minor Excision of Foreign Body    Family History  Problem Relation Age of Onset  . Liver disease Other   . Cancer Father     multiple myeloma, melanoma    No Known Allergies  Current Outpatient Prescriptions on File Prior to Visit  Medication Sig Dispense Refill  . ALPRAZolam (XANAX) 0.5 MG tablet Take 0.5 mg by mouth at bedtime as needed.      [name] Kitchen aspirin EC 81 MG tablet Take 81 mg by mouth daily.      . Cholecalciferol (VITAMIN D-3 PO) Take 1,200 Units by mouth daily.       . fish oil-omega-3 fatty acids 1000 MG capsule Take 2 g by mouth daily.        [name] Kitchen HYDROcodone-acetaminophen (NORCO) 10-325 MG per tablet Take 1 tablet by mouth every 6 (six) hours as needed.      . ondansetron (  ZOFRAN ODT) 8 MG disintegrating tablet 8mg  ODT q4 hours prn nausea  20 tablet  0  . oxyCODONE-acetaminophen (PERCOCET) 5-325 MG per tablet Take 2 tablets by mouth every 4 (four) hours as needed for pain.  15 tablet  0  . Probiotic Product (PROBIOTIC PO) Take by mouth daily. OTC   Does not know dosage      . Tamsulosin HCl (FLOMAX) 0.4 MG CAPS Take 1 capsule (0.4 mg total) by mouth daily.  30 capsule  0   No current facility-administered medications on file prior to visit.    BP 120/86  Pulse 66  Wt 190 lb (86.183 kg)  BMI 26.51 kg/m2chart    Objective:   Physical Exam  Constitutional: He is oriented to person, place, and time. He appears well-developed and well-nourished.  Neck: Normal range of motion. Neck supple.  Cardiovascular: Normal rate, regular rhythm and normal heart sounds.   Pulmonary/Chest: Effort normal and breath sounds normal.  Musculoskeletal: Normal range of motion. He exhibits no  edema.  Right great toe tenderness at the proximal joint. Red, tender, and swollen.  Left ankle-no swelling. No redness, Full range of motion without pain.  Neurological: He is alert and oriented to person, place, and time.  Skin: Skin is warm and dry.     Psychiatric: He has a normal mood and affect.          Assessment & Plan:  Assessment:  1. Neoplasm uncertain behavior/nonhealing-advised patient to consider having it removed and sent to pathology. Patient reports he will consider it and let us know if he decides.  2. Left ankle pain-likely related to arthritis. If symptoms persist, consider x-ray of the left ankle.  3. Right great toe pain-likely related to gout and increased alcohol consumption. Uric acid level sent. Medrol Dosepak as directed. Patient the office if symptoms worsen or persist. Recheck as scheduled, and as needed.

## 2013-06-20 NOTE — Patient Instructions (Addendum)
Gout  Gout is an inflammatory condition (arthritis) caused by a buildup of uric acid crystals in the joints. Uric acid is a chemical that is normally present in the blood. Under some circumstances, uric acid can form into crystals in your joints. This causes joint redness, soreness, and swelling (inflammation). Repeat attacks are common. Over time, uric acid crystals can form into masses (tophi) near a joint, causing disfigurement. Gout is treatable and often preventable.  CAUSES   The disease begins with elevated levels of uric acid in the blood. Uric acid is produced by your body when it breaks down a naturally found substance called purines. This also happens when you eat certain foods such as meats and fish. Causes of an elevated uric acid level include:   Being passed down from parent to child (heredity).   Diseases that cause increased uric acid production (obesity, psoriasis, some cancers).   Excessive alcohol use.   Diet, especially diets rich in meat and seafood.   Medicines, including certain cancer-fighting drugs (chemotherapy), diuretics, and aspirin.   Chronic kidney disease. The kidneys are no longer able to remove uric acid well.   Problems with metabolism.  Conditions strongly associated with gout include:   Obesity.   High blood pressure.   High cholesterol.   Diabetes.  Not everyone with elevated uric acid levels gets gout. It is not understood why some people get gout and others do not. Surgery, joint injury, and eating too much of certain foods are some of the factors that can lead to gout.  SYMPTOMS    An attack of gout comes on quickly. It causes intense pain with redness, swelling, and warmth in a joint.   Fever can occur.   Often, only one joint is involved. Certain joints are more commonly involved:   Base of the big toe.   Knee.   Ankle.   Wrist.   Finger.  Without treatment, an attack usually goes away in a few days to weeks. Between attacks, you usually will not have  symptoms, which is different from many other forms of arthritis.  DIAGNOSIS   Your caregiver will suspect gout based on your symptoms and exam. Removal of fluid from the joint (arthrocentesis) is done to check for uric acid crystals. Your caregiver will give you a medicine that numbs the area (local anesthetic) and use a needle to remove joint fluid for exam. Gout is confirmed when uric acid crystals are seen in joint fluid, using a special microscope. Sometimes, blood, urine, and X-ray tests are also used.  TREATMENT   There are 2 phases to gout treatment: treating the sudden onset (acute) attack and preventing attacks (prophylaxis).  Treatment of an Acute Attack   Medicines are used. These include anti-inflammatory medicines or steroid medicines.   An injection of steroid medicine into the affected joint is sometimes necessary.   The painful joint is rested. Movement can worsen the arthritis.   You may use warm or cold treatments on painful joints, depending which works best for you.   Discuss the use of coffee, vitamin C, or cherries with your caregiver. These may be helpful treatment options.  Treatment to Prevent Attacks  After the acute attack subsides, your caregiver may advise prophylactic medicine. These medicines either help your kidneys eliminate uric acid from your body or decrease your uric acid production. You may need to stay on these medicines for a very long time.  The early phase of treatment with prophylactic medicine can be associated   with an increase in acute gout attacks. For this reason, during the first few months of treatment, your caregiver may also advise you to take medicines usually used for acute gout treatment. Be sure you understand your caregiver's directions.  You should also discuss dietary treatment with your caregiver. Certain foods such as meats and fish can increase uric acid levels. Other foods such as dairy can decrease levels. Your caregiver can give you a list of foods  to avoid.  HOME CARE INSTRUCTIONS    Do not take aspirin to relieve pain. This raises uric acid levels.   Only take over-the-counter or prescription medicines for pain, discomfort, or fever as directed by your caregiver.   Rest the joint as much as possible. When in bed, keep sheets and blankets off painful areas.   Keep the affected joint raised (elevated).   Use crutches if the painful joint is in your leg.   Drink enough water and fluids to keep your urine clear or pale yellow. This helps your body get rid of uric acid. Do not drink alcoholic beverages. They slow the passage of uric acid.   Follow your caregiver's dietary instructions. Pay careful attention to the amount of protein you eat. Your daily diet should emphasize fruits, vegetables, whole grains, and fat-free or low-fat milk products.   Maintain a healthy body weight.  SEEK MEDICAL CARE IF:    You have an oral temperature above 102 F (38.9 C).   You develop diarrhea, vomiting, or any side effects from medicines.   You do not feel better in 24 hours, or you are getting worse.  SEEK IMMEDIATE MEDICAL CARE IF:    Your joint becomes suddenly more tender and you have:   Chills.   An oral temperature above 102 F (38.9 C), not controlled by medicine.  MAKE SURE YOU:    Understand these instructions.   Will watch your condition.   Will get help right away if you are not doing well or get worse.  Document Released: 10/13/2000 Document Revised: 01/08/2012 Document Reviewed: 01/24/2010  [employer] Patient Information 2014 [employer].

## 2013-08-08 ENCOUNTER — Other Ambulatory Visit: Payer: Self-pay | Admitting: Dermatology

## 2013-12-22 ENCOUNTER — Encounter: Payer: Self-pay | Admitting: Family Medicine

## 2013-12-22 ENCOUNTER — Ambulatory Visit (INDEPENDENT_AMBULATORY_CARE_PROVIDER_SITE_OTHER): Payer: BC Managed Care – PPO | Admitting: Family Medicine

## 2013-12-22 VITALS — BP 130/80 | HR 74 | Temp 97.5°F | Wt 190.0 lb

## 2013-12-22 DIAGNOSIS — H6123 Impacted cerumen, bilateral: Secondary | ICD-10-CM

## 2013-12-22 DIAGNOSIS — H6120 Impacted cerumen, unspecified ear: Secondary | ICD-10-CM

## 2013-12-22 NOTE — Progress Notes (Signed)
   Subjective:    Patient ID: Andrew Mcmahon, male    DOB: 1951-03-14, 63 y.o.   MRN: [id number]  HPI  Patient seen with bilateral cerumen impaction This is a recurrent problem for him. He swims almost daily and is recently noticed some fullness left ear. Denies any ear drainage. No ear pain. No dizziness. He's tried over-the-counter wax softeners without much improvement.  Past Medical History  Diagnosis Date  . Cancer     skin  . Back pain     low  . Panic attack   . Kidney stones     history   Past Surgical History  Procedure Laterality Date  . Nasal septum surgery    . Colonoscopy    . Mohs surgery    . Mass excision  08/08/2012    Procedure: MINOR EXCISION OF MASS;  Surgeon: Cammie Sickle., MD;  Location: [facility];  Service: Orthopedics;  Laterality: Right;  Minor Excision of Foreign Body    reports that he has never smoked. He has never used smokeless tobacco. He reports that he drinks alcohol. He reports that he does not use illicit drugs. family history includes Cancer in his father; Liver disease in his other. No Known Allergies   Review of Systems  HENT: Negative for congestion, ear pain and hearing loss.        Objective:   Physical Exam  Constitutional: He appears well-developed and well-nourished.  HENT:  Bilateral cerumen impactions. We were able to clear right canal with curette only. Left canal required curette and irrigation.  No evidence for otitis externa  Cardiovascular: Normal rate.   Pulmonary/Chest: Effort normal and breath sounds normal. No respiratory distress.          Assessment & Plan:  Bilateral cerumen impactions. Removed with curette and irrigation.

## 2013-12-22 NOTE — Progress Notes (Signed)
Pre visit review using our clinic review tool, if applicable. No additional management support is needed unless otherwise documented below in the visit note. 

## 2014-01-27 ENCOUNTER — Other Ambulatory Visit: Payer: BC Managed Care – PPO

## 2014-01-28 ENCOUNTER — Other Ambulatory Visit (INDEPENDENT_AMBULATORY_CARE_PROVIDER_SITE_OTHER): Payer: BC Managed Care – PPO

## 2014-01-28 DIAGNOSIS — Z0000 Encounter for general adult medical examination without abnormal findings: Secondary | ICD-10-CM

## 2014-01-28 LAB — PSA: PSA: 3.03 ng/mL (ref 0.10–4.00)

## 2014-01-28 LAB — POCT URINALYSIS DIPSTICK
Bilirubin, UA: NEGATIVE
Blood, UA: NEGATIVE
GLUCOSE UA: NEGATIVE
KETONES UA: NEGATIVE
LEUKOCYTES UA: NEGATIVE
Nitrite, UA: NEGATIVE
PROTEIN UA: NEGATIVE
SPEC GRAV UA: 1.025
UROBILINOGEN UA: 0.2
pH, UA: 5.5

## 2014-01-28 LAB — CBC WITH DIFFERENTIAL/PLATELET
BASOS ABS: 0 10*3/uL (ref 0.0–0.1)
Basophils Relative: 0.8 % (ref 0.0–3.0)
EOS ABS: 0.1 10*3/uL (ref 0.0–0.7)
Eosinophils Relative: 1.9 % (ref 0.0–5.0)
HCT: 47.4 % (ref 39.0–52.0)
Hemoglobin: 16.2 g/dL (ref 13.0–17.0)
LYMPHS PCT: 38.5 % (ref 12.0–46.0)
Lymphs Abs: 1.7 10*3/uL (ref 0.7–4.0)
MCHC: 34.2 g/dL (ref 30.0–36.0)
MCV: 94.4 fl (ref 78.0–100.0)
MONOS PCT: 10.9 % (ref 3.0–12.0)
Monocytes Absolute: 0.5 10*3/uL (ref 0.1–1.0)
Neutro Abs: 2.2 10*3/uL (ref 1.4–7.7)
Neutrophils Relative %: 47.9 % (ref 43.0–77.0)
PLATELETS: 193 10*3/uL (ref 150.0–400.0)
RBC: 5.02 Mil/uL (ref 4.22–5.81)
RDW: 13.3 % (ref 11.5–14.6)
WBC: 4.5 10*3/uL (ref 4.5–10.5)

## 2014-01-28 LAB — BASIC METABOLIC PANEL
BUN: 22 mg/dL (ref 6–23)
CALCIUM: 9.2 mg/dL (ref 8.4–10.5)
CO2: 25 meq/L (ref 19–32)
CREATININE: 1 mg/dL (ref 0.4–1.5)
Chloride: 104 mEq/L (ref 96–112)
GFR: 85.09 mL/min (ref >60.00)
GLUCOSE: 91 mg/dL (ref 70–99)
Potassium: 4.3 mEq/L (ref 3.5–5.1)
Sodium: 136 mEq/L (ref 135–145)

## 2014-01-28 LAB — HEPATIC FUNCTION PANEL
ALBUMIN: 4.4 g/dL (ref 3.5–5.2)
ALK PHOS: 46 U/L (ref 39–117)
ALT: 25 U/L (ref 0–53)
AST: 21 U/L (ref 0–37)
BILIRUBIN TOTAL: 0.7 mg/dL (ref 0.3–1.2)
Bilirubin, Direct: 0.1 mg/dL (ref 0.0–0.3)
Total Protein: 6.8 g/dL (ref 6.0–8.3)

## 2014-01-28 LAB — LIPID PANEL
CHOLESTEROL: 228 mg/dL — AB (ref 0–200)
HDL: 39.5 mg/dL (ref >39.00)
LDL CALC: 164 mg/dL — AB (ref 0–99)
TRIGLYCERIDES: 123 mg/dL (ref 0.0–149.0)
Total CHOL/HDL Ratio: 6
VLDL: 24.6 mg/dL (ref 0.0–40.0)

## 2014-01-28 LAB — TSH: TSH: 1.57 u[IU]/mL (ref 0.35–5.50)

## 2014-02-05 ENCOUNTER — Encounter: Payer: Self-pay | Admitting: Family Medicine

## 2014-02-05 ENCOUNTER — Ambulatory Visit (INDEPENDENT_AMBULATORY_CARE_PROVIDER_SITE_OTHER): Payer: BC Managed Care – PPO | Admitting: Family Medicine

## 2014-02-05 VITALS — BP 120/80 | Temp 98.6°F | Ht 70.5 in | Wt 198.0 lb

## 2014-02-05 DIAGNOSIS — Z0000 Encounter for general adult medical examination without abnormal findings: Secondary | ICD-10-CM

## 2014-02-05 DIAGNOSIS — F410 Panic disorder [episodic paroxysmal anxiety] without agoraphobia: Secondary | ICD-10-CM

## 2014-02-05 DIAGNOSIS — Z23 Encounter for immunization: Secondary | ICD-10-CM

## 2014-02-05 DIAGNOSIS — Z85828 Personal history of other malignant neoplasm of skin: Secondary | ICD-10-CM

## 2014-02-05 MED ORDER — ALPRAZOLAM 0.5 MG PO TABS: ORAL_TABLET | ORAL | Status: DC

## 2014-02-05 NOTE — Progress Notes (Signed)
   Subjective:    Patient ID: Andrew Mcmahon, male    DOB: 12/09/1950, 63 y.o.   MRN: [id number]  HPI Cesar is a 63 year old married male nonsmoker who comes in today for general physical examination  He's always been in excellent health he has no chronic health problems. He takes Xanax 0.5 dose one half tablet when necessary for panic attacks.  He gets routine eye care, dental care, colonoscopy in 2007 normal,  Vaccinations up to date  He is retired enjoys [employer]   Review of Systems  Constitutional: Negative.   HENT: Negative.   Eyes: Negative.   Respiratory: Negative.   Cardiovascular: Negative.   Gastrointestinal: Negative.   Genitourinary: Negative.   Musculoskeletal: Negative.   Skin: Negative.   Neurological: Negative.   Psychiatric/Behavioral: Negative.    Reviewed    Objective:   Physical Exam  Nursing note and vitals reviewed. Constitutional: He is oriented to person, place, and time. He appears well-developed and well-nourished.  HENT:  Head: Normocephalic and atraumatic.  Right Ear: External ear normal.  Left Ear: External ear normal.  Nose: Nose normal.  Mouth/Throat: Oropharynx is clear and moist.  Eyes: Conjunctivae and EOM are normal. Pupils are equal, round, and reactive to light.  Neck: Normal range of motion. Neck supple. No JVD present. No tracheal deviation present. No thyromegaly present.  Cardiovascular: Normal rate, regular rhythm, normal heart sounds and intact distal pulses.  Exam reveals no gallop and no friction rub.   No murmur heard. Pulmonary/Chest: Effort normal and breath sounds normal. No stridor. No respiratory distress. He has no wheezes. He has no rales. He exhibits no tenderness.  Abdominal: Soft. Bowel sounds are normal. He exhibits no distension and no mass. There is no tenderness. There is no rebound and no guarding.  Grade 1 a right inguinal hernia  Genitourinary: Rectum normal, prostate normal and penis  normal. Guaiac negative stool. No penile tenderness.  Musculoskeletal: Normal range of motion. He exhibits no edema and no tenderness.  Lymphadenopathy:    He has no cervical adenopathy.  Neurological: He is alert and oriented to person, place, and time. He has normal reflexes. No cranial nerve deficit. He exhibits normal muscle tone.  Skin: Skin is warm and dry. No rash noted. No erythema. No pallor.  Psychiatric: He has a normal mood and affect. His behavior is normal. Judgment and thought content normal.          Assessment & Plan:  Healthy male  Right inguinal hernia grade 1,,,,, observed  Episodic panic attacks Xanax 0.5,,,,,,, one half tab when necessary,

## 2014-02-05 NOTE — Progress Notes (Signed)
Pre visit review using our clinic review tool, if applicable. No additional management support is needed unless otherwise documented below in the visit note. 

## 2014-02-05 NOTE — Patient Instructions (Signed)
Continue your good health habits  Xanax 0.5,,,,,,,,,,, one half tab when necessary  The hernia is a grade 1,,,,,,,,,,,, if you have any questions or it becomes symptomatic,,,,,,,,,, I would consult with Dr. Shirlee More toth

## 2014-06-08 ENCOUNTER — Telehealth: Payer: Self-pay | Admitting: Family Medicine

## 2014-06-08 DIAGNOSIS — K4090 Unilateral inguinal hernia, without obstruction or gangrene, not specified as recurrent: Secondary | ICD-10-CM

## 2014-06-08 NOTE — Telephone Encounter (Signed)
Pt was seen on 02-05-14. And md discovery a hernia. Pt would like a referral to a [facility]

## 2014-06-23 ENCOUNTER — Ambulatory Visit (INDEPENDENT_AMBULATORY_CARE_PROVIDER_SITE_OTHER): Payer: BC Managed Care – PPO | Admitting: Surgery

## 2014-06-23 ENCOUNTER — Encounter (INDEPENDENT_AMBULATORY_CARE_PROVIDER_SITE_OTHER): Payer: Self-pay | Admitting: Surgery

## 2014-06-23 VITALS — BP 122/70 | HR 62 | Resp 16 | Ht 71.0 in | Wt 200.0 lb

## 2014-06-23 DIAGNOSIS — K4090 Unilateral inguinal hernia, without obstruction or gangrene, not specified as recurrent: Secondary | ICD-10-CM | POA: Insufficient documentation

## 2014-06-23 NOTE — Progress Notes (Signed)
Patient ID: Andrew Mcmahon, male   DOB: 1951/03/22, 63 y.o.   MRN: [id number]  Chief Complaint  Patient presents with  . Inguinal Hernia    HPI Andrew Mcmahon is a 63 y.o. male.   HPI This is a very pleasant gentleman referred by Dr. Stevie Kern for evaluation of a right inguinal hernia.  This was first noticed on his yearly physical examination in April. He was completely asymptomatic at that time.  Since that time, he has had occasional very slight discomfort after coughing bout in the right groin. He has noticed a very slight bulge. Other than that feels like he is mostly asymptomatic. He has had no obstructive symptoms. He is otherwise without complaints. He is a very active individual Past Medical History  Diagnosis Date  . Cancer     skin  . Back pain     low  . Panic attack   . Kidney stones     history    Past Surgical History  Procedure Laterality Date  . Nasal septum surgery    . Colonoscopy    . Mohs surgery    . Mass excision  08/08/2012    Procedure: MINOR EXCISION OF MASS;  Surgeon: Cammie Sickle., MD;  Location: [facility];  Service: Orthopedics;  Laterality: Right;  Minor Excision of Foreign Body    Family History  Problem Relation Age of Onset  . Liver disease Other   . Cancer Father     multiple myeloma, melanoma    Social History History  Substance Use Topics  . Smoking status: Never Smoker   . Smokeless tobacco: Never Used  . Alcohol Use: Yes     Comment: daily    No Known Allergies  Current Outpatient Prescriptions  Medication Sig Dispense Refill  . aspirin EC 81 MG tablet Take 81 mg by mouth daily.      . fish oil-omega-3 fatty acids 1000 MG capsule Take 2 g by mouth daily.        . Probiotic Product (PROBIOTIC PO) Take by mouth daily. OTC   Does not know dosage      . ALPRAZolam (XANAX) 0.5 MG tablet One half to one tablet when necessary  30 tablet  1   No current facility-administered medications for this visit.     Review of Systems Review of Systems  Constitutional: Negative for fever, chills and unexpected weight change.  HENT: Negative for congestion, hearing loss, sore throat, trouble swallowing and voice change.   Eyes: Negative for visual disturbance.  Respiratory: Negative for cough and wheezing.   Cardiovascular: Negative for chest pain, palpitations and leg swelling.  Gastrointestinal: Negative for nausea, vomiting, abdominal pain, diarrhea, constipation, blood in stool, abdominal distention, anal bleeding and rectal pain.  Genitourinary: Negative for hematuria and difficulty urinating.  Musculoskeletal: Negative for arthralgias.  Skin: Negative for rash and wound.  Neurological: Negative for seizures, syncope, weakness and headaches.  Hematological: Negative for adenopathy. Does not bruise/bleed easily.  Psychiatric/Behavioral: Negative for confusion.    Blood pressure 122/70, pulse 62, resp. rate 16, height '5\' 11"'  (1.803 m), weight 200 lb (90.719 kg).  Physical Exam Physical Exam  Constitutional: He is oriented to person, place, and time. He appears well-developed and well-nourished.  HENT:  Head: Normocephalic and atraumatic.  Right Ear: External ear normal.  Left Ear: External ear normal.  Nose: Nose normal.  Mouth/Throat: Oropharynx is clear and moist. No oropharyngeal exudate.  Eyes: Conjunctivae are normal. Pupils  are equal, round, and reactive to light. Right eye exhibits no discharge. Left eye exhibits no discharge. No scleral icterus.  Neck: Normal range of motion. Neck supple. No tracheal deviation present.  Cardiovascular: Normal rate, regular rhythm, normal heart sounds and intact distal pulses.   No murmur heard. Pulmonary/Chest: Effort normal and breath sounds normal.  Abdominal: Soft. Bowel sounds are normal. He exhibits no distension. There is no tenderness.  There is a very small easily reducible right inguinal hernia.  Musculoskeletal: Normal range of motion.  He exhibits no edema and no tenderness.  Lymphadenopathy:    He has no cervical adenopathy.  Neurological: He is alert and oriented to person, place, and time.  Skin: Skin is warm and dry. No rash noted. No erythema.  Psychiatric: His behavior is normal. Judgment normal.    Data Reviewed   Assessment    Right inguinal hernia.     Plan    I discussed the diagnosis with the patient and his wife. I discussed continued conservative management versus repair with mesh. I discussed both laparoscopic and open techniques as well as risks of surgery. I discussed risks of incarceration without surgery. At this point, he was to continue conservative management which I feel is very reasonable as this is a very small and virtually asymptomatic hernia. Should he change his mind or develop symptoms, he will call me back and we will again readdress probable laparoscopic repair with mesh        Preet Perrier A 06/23/2014, 2:46 PM

## 2014-06-23 NOTE — Patient Instructions (Signed)
Please call us back if you become symptomatic from the hernia and minimal to discuss surgical repair

## 2014-12-17 ENCOUNTER — Other Ambulatory Visit (INDEPENDENT_AMBULATORY_CARE_PROVIDER_SITE_OTHER): Payer: Self-pay | Admitting: Surgery

## 2014-12-25 ENCOUNTER — Encounter (HOSPITAL_BASED_OUTPATIENT_CLINIC_OR_DEPARTMENT_OTHER): Payer: Self-pay | Admitting: *Deleted

## 2014-12-25 NOTE — Progress Notes (Signed)
   12/25/14 1623  OBSTRUCTIVE SLEEP APNEA  Have you ever been diagnosed with sleep apnea through a sleep study? No  Do you snore loudly (loud enough to be heard through closed doors)?  1  Do you often feel tired, fatigued, or sleepy during the daytime? 0  Has anyone observed you stop breathing during your sleep? 0  Do you have, or are you being treated for high blood pressure? 0  BMI more than 35 kg/m2? 0  Age over 64 years old? 1  Neck circumference greater than 40 cm/16 inches? 1  Gender: 1

## 2014-12-25 NOTE — Progress Notes (Signed)
No labs needed

## 2014-12-30 ENCOUNTER — Ambulatory Visit (HOSPITAL_BASED_OUTPATIENT_CLINIC_OR_DEPARTMENT_OTHER): Admission: RE | Admit: 2014-12-30 | Payer: 59 | Source: Ambulatory Visit | Admitting: Surgery

## 2014-12-30 HISTORY — DX: Other specified postprocedural states: Z98.890

## 2014-12-30 HISTORY — DX: Nausea with vomiting, unspecified: R11.2

## 2014-12-30 HISTORY — DX: Presence of spectacles and contact lenses: Z97.3

## 2014-12-30 SURGERY — REPAIR, HERNIA, INGUINAL, LAPAROSCOPIC
Anesthesia: General | Laterality: Right

## 2015-02-09 ENCOUNTER — Ambulatory Visit: Payer: BC Managed Care – PPO | Admitting: Family Medicine

## 2015-02-23 ENCOUNTER — Ambulatory Visit (INDEPENDENT_AMBULATORY_CARE_PROVIDER_SITE_OTHER): Payer: 59 | Admitting: Family Medicine

## 2015-02-23 ENCOUNTER — Encounter: Payer: Self-pay | Admitting: Family Medicine

## 2015-02-23 VITALS — BP 136/88 | HR 70 | Temp 98.0°F | Ht 70.25 in | Wt 202.9 lb

## 2015-02-23 DIAGNOSIS — Z85828 Personal history of other malignant neoplasm of skin: Secondary | ICD-10-CM | POA: Diagnosis not present

## 2015-02-23 DIAGNOSIS — K4090 Unilateral inguinal hernia, without obstruction or gangrene, not specified as recurrent: Secondary | ICD-10-CM | POA: Diagnosis not present

## 2015-02-23 DIAGNOSIS — H6122 Impacted cerumen, left ear: Secondary | ICD-10-CM | POA: Diagnosis not present

## 2015-02-23 DIAGNOSIS — F410 Panic disorder [episodic paroxysmal anxiety] without agoraphobia: Secondary | ICD-10-CM

## 2015-02-23 NOTE — Progress Notes (Addendum)
HPI:  Andrew Mcmahon is here to establish care. He used to see Dr. Sherren Mocha.  Has the following chronic problems that require follow up and concerns today:  R inguinal hernia: -reports he saw a surgeon for this and since is not bothering him much has opted to hold off on surgery due to cost -he reports mild discomfort with certain activities but no pain -he swims without issue and does core strengthening without problem -denies: significant pain, changes in bowel  Panic disorder: -uses one half of xanax very rarely for panic attack - usually [place] -on zoloft remotely when working -denies: depression or GAD  Cerumen impaction: -reports this is a recurrent issue -feels like may be getting a build up - fullness in ears  -denies: pain, hearing loss, drainage from ears, tinittus  ROS negative for unless reported above: fevers, unintentional weight loss, hearing or vision loss, chest pain, palpitations, struggling to breath, hemoptysis, melena, hematochezia, hematuria, falls, loc, si, thoughts of self harm  Past Medical History  Diagnosis Date  . Back pain     low, hx radiculopathy, saw ortho in the past  . Panic attack   . Kidney stones     history  . PONV (postoperative nausea and vomiting)   . Wears glasses   . 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 Right inguinal hernia     Past Surgical History  Procedure Laterality Date  . Nasal septum surgery    . Colonoscopy    . Mohs surgery    . Mass excision  08/08/2012    Procedure: MINOR EXCISION OF MASS;  Surgeon: Cammie Sickle., MD;  Location: [facility];  Service: Orthopedics;  Laterality: Right;  Minor Excision of Foreign Body    Family History  Problem Relation Age of Onset  . Liver disease Other   . Cancer Father     multiple myeloma, melanoma    History   Social History  . Marital Status: Married    Spouse Name: N/A  . Number of Children: 0  .  Years of Education: N/A   Occupational History  . consultant    Social History Main Topics  . Smoking status: Never Smoker   . Smokeless tobacco: Never Used  . Alcohol Use: Yes     Comment: daily  . Drug Use: No  . Sexual Activity: Not on file   Other Topics Concern  . None   Social History Narrative     Current outpatient prescriptions:  .  aspirin EC 81 MG tablet, Take 81 mg by mouth daily., Disp: , Rfl:  .  fish oil-omega-3 fatty acids 1000 MG capsule, Take 2 g by mouth daily.  , Disp: , Rfl:  .  Probiotic Product (PROBIOTIC PO), Take by mouth daily. OTC   Does not know dosage, Disp: , Rfl:  .  ALPRAZolam (XANAX) 0.5 MG tablet, One half to one tablet when necessary (Patient not taking: Reported on 02/23/2015), Disp: 30 tablet, Rfl: 1  EXAM:  Filed Vitals:   02/23/15 0853  BP: 136/88  Pulse: 70  Temp: 98 F (36.7 C)    Body mass index is 28.92 kg/(m^2).  GENERAL: vitals reviewed and listed above, alert, oriented, appears well hydrated and in no acute distress  HEENT: atraumatic, conjunttiva clear, no obvious abnormalities on inspection of external nose and ears, cerumen impaction L ear canal  NECK: no obvious masses  on inspection  LUNGS: clear to auscultation bilaterally, no wheezes, rales or rhonchi, good air movement  CV: HRRR, no peripheral edema  MS: moves all extremities without noticeable abnormality  PSYCH: pleasant and cooperative, no obvious depression or anxiety  ASSESSMENT AND PLAN:  Discussed the following assessment and plan:  Panic attacks -rare use of benzo -advise CBT if he would like and advised this is a better way to treat then benzos, would rx low dose benzo if he wishes to cont given rare use  Right inguinal hernia -discussed risks, he is going to cont to monitor  SKIN CANCER, HX OF -advised good skin protection and yearly exams  Cerumen impaction L -he opted for lavage after discussion risks/benefits -performed by staff,  symptoms improved  -We reviewed the PMH, PSH, FH, SH, Meds and Allergies. -We provided refills for any medications we will prescribe as needed. -We addressed current concerns per orders and patient instructions. -We have asked for records for pertinent exams, studies, vaccines and notes from previous providers. -We have advised patient to follow up per instructions below.   -Patient advised to return or notify a doctor immediately if symptoms worsen or persist or new concerns arise.  Patient Instructions  BEFORE YOU LEAVE: -schedule physical 3-6 months  We recommend the following healthy lifestyle measures: - eat a healthy diet consisting of lots of vegetables, fruits, beans, nuts, seeds, healthy meats such as white chicken and fish and whole grains.  - avoid fried foods, fast food, processed foods, sodas, red meet and other fattening foods.  - get a least 150 minutes of aerobic exercise per week.        Colin Benton R.

## 2015-02-23 NOTE — Patient Instructions (Signed)
BEFORE YOU LEAVE: -schedule physical 3-6 months  We recommend the following healthy lifestyle measures: - eat a healthy diet consisting of lots of vegetables, fruits, beans, nuts, seeds, healthy meats such as white chicken and fish and whole grains.  - avoid fried foods, fast food, processed foods, sodas, red meet and other fattening foods.  - get a least 150 minutes of aerobic exercise per week.

## 2015-02-23 NOTE — Progress Notes (Signed)
Pre visit review using our clinic review tool, if applicable. No additional management support is needed unless otherwise documented below in the visit note. 

## 2015-06-11 ENCOUNTER — Encounter: Payer: Self-pay | Admitting: Family Medicine

## 2015-06-11 ENCOUNTER — Ambulatory Visit (INDEPENDENT_AMBULATORY_CARE_PROVIDER_SITE_OTHER): Payer: 59 | Admitting: Family Medicine

## 2015-06-11 VITALS — BP 132/88 | HR 64 | Temp 98.2°F | Wt 202.0 lb

## 2015-06-11 DIAGNOSIS — Z87898 Personal history of other specified conditions: Secondary | ICD-10-CM

## 2015-06-11 DIAGNOSIS — E785 Hyperlipidemia, unspecified: Secondary | ICD-10-CM | POA: Diagnosis not present

## 2015-06-11 DIAGNOSIS — Z0000 Encounter for general adult medical examination without abnormal findings: Secondary | ICD-10-CM

## 2015-06-11 LAB — LIPID PANEL
CHOL/HDL RATIO: 7
Cholesterol: 247 mg/dL — ABNORMAL HIGH (ref 0–200)
HDL: 35 mg/dL — AB (ref >39.00)
NonHDL: 212.37
Triglycerides: 246 mg/dL — ABNORMAL HIGH (ref 0.0–149.0)
VLDL: 49.2 mg/dL — ABNORMAL HIGH (ref 0.0–40.0)

## 2015-06-11 LAB — LDL CHOLESTEROL, DIRECT: LDL DIRECT: 164 mg/dL

## 2015-06-11 LAB — PSA: PSA: 2.5 ng/mL (ref 0.10–4.00)

## 2015-06-11 NOTE — Progress Notes (Signed)
HPI:  Here for CPE:  -Concerns and/or follow up today:   HLD: -elevated significantly -on fish oil for this -uses french press  Elevated PSA/Nephrolithiasis: -he has seen urology and has had several biopsies in the past -denies LUTS, pain, fevers, malaise -seeing Dr. Alinda Money now in urology   -Diet: variety of foods, balance and well rounded  -Exercise: regular exercise - swimming daily, walks some, core strengthing  -Diabetes and Dyslipidemia Screening: hx elevated cholesterol in the past  -Hx of HTN: no  -Vaccines: UTD  -sexual activity: yes, male partner, no new partners  -wants STI testing, Hep C screening (if born 96-1965): declined  -FH colon or prstate ca: see FH Last colon cancer screening: due next year Last prostate ca screening:  Sees urologist, but wants to check PSA here as hx of elevated PSA, no symptoms   -Alcohol, Tobacco, drug use: see social history  Review of Systems - no fevers, unintentional weight loss, vision loss, hearing loss, chest pain, sob, hemoptysis, melena, hematochezia, hematuria, genital discharge, changing or concerning skin lesions, bleeding, bruising, loc, thoughts of self harm or SI  Past Medical History  Diagnosis Date  . Back pain     low, hx radiculopathy, saw ortho in the past  . Panic attack   . Kidney stones     history  . PONV (postoperative nausea and vomiting)   . Wears glasses   . 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 Right inguinal hernia     Past Surgical History  Procedure Laterality Date  . Nasal septum surgery    . Colonoscopy    . Mohs surgery    . Mass excision  08/08/2012    Procedure: MINOR EXCISION OF MASS;  Surgeon: Cammie Sickle., MD;  Location: [facility];  Service: Orthopedics;  Laterality: Right;  Minor Excision of Foreign Body    Family History  Problem Relation Age of Onset  . Liver disease Other   . Cancer Father     multiple myeloma, melanoma     Social History   Social History  . Marital Status: Married    Spouse Name: N/A  . Number of Children: 0  . Years of Education: N/A   Occupational History  . consultant    Social History Main Topics  . Smoking status: Never Smoker   . Smokeless tobacco: Never Used  . Alcohol Use: Yes     Comment: daily  . Drug Use: No  . Sexual Activity: Not Asked   Other Topics Concern  . None   Social History Narrative     Current outpatient prescriptions:  .  ALPRAZolam (XANAX) 0.5 MG tablet, One half to one tablet when necessary, Disp: 30 tablet, Rfl: 1 .  aspirin EC 81 MG tablet, Take 81 mg by mouth daily., Disp: , Rfl:  .  fish oil-omega-3 fatty acids 1000 MG capsule, Take 2 g by mouth daily.  , Disp: , Rfl:  .  Probiotic Product (PROBIOTIC PO), Take by mouth daily. OTC   Does not know dosage, Disp: , Rfl:   EXAM:  Filed Vitals:   06/11/15 0814  BP: 132/88  Pulse: 64  Temp: 98.2 F (36.8 C)  TempSrc: Oral  Weight: 202 lb (91.627 kg)    Estimated body mass index is 28.79 kg/(m^2) as calculated from the following:   Height as of 02/23/15: 5' 10.25" (1.784 m).   Weight as of this encounter: 202  lb (91.627 kg).  GENERAL: vitals reviewed and listed below, alert, oriented, appears well hydrated and in no acute distress  HEENT: head atraumatic, PERRLA, normal appearance of eyes, ears, nose and mouth. moist mucus membranes.  NECK: supple, no masses or lymphadenopathy  LUNGS: clear to auscultation bilaterally, no rales, rhonchi or wheeze  CV: HRRR, no peripheral edema or cyanosis, normal pedal pulses  ABDOMEN: bowel sounds normal, soft, non tender to palpation, no masses, no rebound or guarding  KG:[id number], sees urologist  RECTAL: declined, sees urologist  SKIN: no rash or abnormal lesions  MS: normal gait, moves all extremities normally  NEURO: CN II-XII grossly intact, normal muscle strength and sensation to light touch on extremities  PSYCH: normal affect,  pleasant and cooperative  ASSESSMENT AND PLAN:  Discussed the following assessment and plan:  Visit for preventive health examination - Plan: Lipid Panel  History of elevated PSA - Plan: PSA -he sees urologist for urology exams, will chk PSA, no symptoms  Hyperlipemia - Plan: Lipid Panel -he may stop french press if still elevated and recheck in 3 months  -Discussed and advised all [place] preventive services health task force level A and B recommendations for age, sex and risks.  -Advised at least 150 minutes of exercise per week and a healthy diet low in saturated fats and sweets and consisting of fresh fruits and vegetables, lean meats such as fish and white chicken and whole grains.  -FASTING labs, studies and vaccines per orders this encounter  Patient advised to return to clinic immediately if symptoms worsen or persist or new concerns.  There are no Patient Instructions on file for this visit.  Return in about 1 year (around 06/10/2016) for CPE.   Lucretia Kern.

## 2015-06-11 NOTE — Progress Notes (Signed)
Pre visit review using our clinic review tool, if applicable. No additional management support is needed unless otherwise documented below in the visit note. 

## 2015-06-15 NOTE — Addendum Note (Signed)
Addended by: Agnes Lawrence on: 06/15/2015 08:25 AM   Modules accepted: Orders

## 2015-09-14 ENCOUNTER — Other Ambulatory Visit (INDEPENDENT_AMBULATORY_CARE_PROVIDER_SITE_OTHER): Payer: 59

## 2015-09-14 DIAGNOSIS — E785 Hyperlipidemia, unspecified: Secondary | ICD-10-CM

## 2015-09-14 LAB — LIPID PANEL
CHOL/HDL RATIO: 5
Cholesterol: 216 mg/dL — ABNORMAL HIGH (ref 0–200)
HDL: 39.6 mg/dL (ref >39.00)
LDL Cholesterol: 149 mg/dL — ABNORMAL HIGH (ref 0–99)
NONHDL: 176.84
Triglycerides: 137 mg/dL (ref 0.0–149.0)
VLDL: 27.4 mg/dL (ref 0.0–40.0)

## 2015-09-30 DIAGNOSIS — S82891A Other fracture of right lower leg, initial encounter for closed fracture: Secondary | ICD-10-CM

## 2015-09-30 HISTORY — DX: Other fracture of right lower leg, initial encounter for closed fracture: S82.891A

## 2015-10-06 ENCOUNTER — Telehealth: Payer: Self-pay | Admitting: Family Medicine

## 2015-10-06 NOTE — Telephone Encounter (Signed)
I called the pt and informed him of the message below and he stated he is OK with waiting until tomorrow.

## 2015-10-06 NOTE — Telephone Encounter (Signed)
Looks like has appt tomorrow? Is he ok with that?If not can put him in if any openings here today with me or another provider.

## 2015-10-06 NOTE — Telephone Encounter (Signed)
PLEASE NOTE: All timestamps contained within this report are represented as [place] Standard Time. CONFIDENTIALTY NOTICE: This fax transmission is intended only for the addressee. It contains information that is legally privileged, confidential or otherwise protected from use or disclosure. If you are not the intended recipient, you are strictly prohibited from reviewing, disclosing, copying using or disseminating any of this information or taking any action in reliance on or regarding this information. If you have received this fax in error, please notify us immediately by telephone so that we can arrange for its return to [place]. Phone: [phone], Toll-Free: [phone], Fax: [phone] Page: 1 of 1 Call Id: [id number] [facility] - [facility] [place] Patient Name: Andrew Mcmahon [facility] DOB: Feb 08, 1951 Initial Comment Caller states he has a lump on his right middle finger. By the joint. Nurse Assessment Nurse: Marcelline Deist, RN, Lynda Date/Time (Eastern Time): 10/06/2015 9:24:09 AM Confirm and document reason for call. If symptomatic, describe symptoms. ---Caller states he has a lump on his right middle finger. By the joint. Did research on internet. Thinks it could be a myxoid. It misshapes his fingernail, above the joint. It appeared about a week ago. It is uncomfortable, not painful. It is about the size of a pencil eraser. Looks like it is filled with fluid or gel. Has the patient traveled out of the country within the last 30 days? ---Not Applicable Does the patient have any new or worsening symptoms? ---Yes Will a triage be completed? ---Yes Related visit to physician within the last 2 weeks? ---No Does the PT have any chronic conditions? (i.e. diabetes, asthma, etc.) ---No Is this a behavioral health or substance abuse call? ---No Guidelines Guideline Title Affirmed Question Affirmed Notes Skin Lesion - Moles or Growths [1]  Skin growth or mole AND [2] changes color, or it has more than one color Final Disposition User See PCP within [facility], RN, Kermit Balo Comments Please contact caller to remind him of appt. tomorrow morning. He was unable to write it down as he was walking downtown while on the phone with nurse. Disagree/Comply: Comply

## 2015-10-07 ENCOUNTER — Encounter: Payer: Self-pay | Admitting: *Deleted

## 2015-10-07 ENCOUNTER — Encounter: Payer: Self-pay | Admitting: Family Medicine

## 2015-10-07 ENCOUNTER — Ambulatory Visit (INDEPENDENT_AMBULATORY_CARE_PROVIDER_SITE_OTHER): Payer: 59 | Admitting: Family Medicine

## 2015-10-07 VITALS — BP 128/78 | HR 62 | Temp 97.7°F | Ht 70.25 in | Wt 206.2 lb

## 2015-10-07 DIAGNOSIS — M6740 Ganglion, unspecified site: Secondary | ICD-10-CM | POA: Diagnosis not present

## 2015-10-07 DIAGNOSIS — M67449 Ganglion, unspecified hand: Secondary | ICD-10-CM

## 2015-10-07 NOTE — Patient Instructions (Signed)
Keep clean and dry and monitor for any signs of infection  Follow up as needed

## 2015-10-07 NOTE — Progress Notes (Signed)
Pre visit review using our clinic review tool, if applicable. No additional management support is needed unless otherwise documented below in the visit note. 

## 2015-10-07 NOTE — Progress Notes (Signed)
HPI:  Acute visit for:  Lump on R 3rd digit: -started a few weeks ago -not painful, no drainage or surrounding swelling or erythema  ROS: See pertinent positives and negatives per HPI.  Past Medical History  Diagnosis Date  . Back pain     low, hx radiculopathy, saw ortho in the past  . Panic attack   . Kidney stones     history  . PONV (postoperative nausea and vomiting)   . Wears glasses   . 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 Right inguinal hernia     Past Surgical History  Procedure Laterality Date  . Nasal septum surgery    . Colonoscopy    . Mohs surgery    . Mass excision  08/08/2012    Procedure: MINOR EXCISION OF MASS;  Surgeon: Cammie Sickle., MD;  Location: [facility];  Service: Orthopedics;  Laterality: Right;  Minor Excision of Foreign Body    Family History  Problem Relation Age of Onset  . Liver disease Other   . Cancer Father     multiple myeloma, melanoma    Social History   Social History  . Marital Status: Married    Spouse Name: N/A  . Number of Children: 0  . Years of Education: N/A   Occupational History  . consultant    Social History Main Topics  . Smoking status: Never Smoker   . Smokeless tobacco: Never Used  . Alcohol Use: Yes     Comment: daily  . Drug Use: No  . Sexual Activity: Not Asked   Other Topics Concern  . None   Social History Narrative     Current outpatient prescriptions:  .  ALPRAZolam (XANAX) 0.5 MG tablet, One half to one tablet when necessary, Disp: 30 tablet, Rfl: 1 .  aspirin EC 81 MG tablet, Take 81 mg by mouth daily., Disp: , Rfl:  .  fish oil-omega-3 fatty acids 1000 MG capsule, Take 2 g by mouth daily.  , Disp: , Rfl:  .  Probiotic Product (PROBIOTIC PO), Take by mouth daily. OTC   Does not know dosage, Disp: , Rfl:   EXAM:  Filed Vitals:   10/07/15 0948  BP: 128/78  Pulse: 62  Temp: 97.7 F (36.5 C)    Body mass index is 29.39 kg/(m^2).  GENERAL:  vitals reviewed and listed above, alert, oriented, appears well hydrated and in no acute distress  HEENT: atraumatic, conjunttiva clear, no obvious abnormalities on inspection of external nose and ears  SKIN: digital mucus cyst R thrid digit with minimal nail deformity  MS: moves all extremities without noticeable abnormality  PSYCH: pleasant and cooperative, no obvious depression or anxiety  ASSESSMENT AND PLAN:  Discussed the following assessment and plan:  Digital mucous cyst  -we discussed possible serious and likely etiologies, workup and treatment options (excision/fusion, cryo, observation, drainage, ing, etc), treatment risks and return precautions -after this discussion, Andrew Mcmahon opted for trial drainage - advised likely will require repeat  -follow up advised as needed if recurs or problems with healing or signs of infection Procedure cyst drainage: -advised of risks and informed consent obtained. Time out performed. Area prepped with betadine. Cyst punctured with sterile 25 gauge needed and gelatinous fluid materials expressed. abx ointment and dressing applied. Tolerated well without complications. Wound care recs and follow up recs provided. -of course, we advised Andrew Mcmahon  to return or notify a doctor immediately if  symptoms worsen or persist or new concerns arise.   -Patient advised to return or notify a doctor immediately if symptoms worsen or persist or new concerns arise.  Patient Instructions  Keep clean and dry and monitor for any signs of infection  Follow up as needed     Breindel Collier R.

## 2015-10-19 ENCOUNTER — Ambulatory Visit (INDEPENDENT_AMBULATORY_CARE_PROVIDER_SITE_OTHER): Payer: 59 | Admitting: Family Medicine

## 2015-10-19 ENCOUNTER — Encounter: Payer: Self-pay | Admitting: *Deleted

## 2015-10-19 ENCOUNTER — Encounter: Payer: Self-pay | Admitting: Family Medicine

## 2015-10-19 VITALS — BP 122/82 | HR 69 | Temp 98.2°F | Ht 70.25 in | Wt 204.8 lb

## 2015-10-19 DIAGNOSIS — M6740 Ganglion, unspecified site: Secondary | ICD-10-CM | POA: Diagnosis not present

## 2015-10-19 DIAGNOSIS — M67449 Ganglion, unspecified hand: Secondary | ICD-10-CM

## 2015-10-19 NOTE — Progress Notes (Signed)
HPI:   Digital Mucus Cyst: -drained after discussion various options several weeks ago per his wishes -he reports resolved then recurred the last several days -he is aware of risks and likely recurrence with drainage but prefers to try repeating this -denies:sig pain, warmth, edema, erythema, signs of infection  ROS: See pertinent positives and negatives per HPI.  Past Medical History  Diagnosis Date  . Back pain     low, hx radiculopathy, saw ortho in the past  . Panic attack   . Kidney stones     history  . PONV (postoperative nausea and vomiting)   . Wears glasses   . 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 Right inguinal hernia     Past Surgical History  Procedure Laterality Date  . Nasal septum surgery    . Colonoscopy    . Mohs surgery    . Mass excision  08/08/2012    Procedure: MINOR EXCISION OF MASS;  Surgeon: Cammie Sickle., MD;  Location: [facility];  Service: Orthopedics;  Laterality: Right;  Minor Excision of Foreign Body    Family History  Problem Relation Age of Onset  . Liver disease Other   . Cancer Father     multiple myeloma, melanoma    Social History   Social History  . Marital Status: Married    Spouse Name: N/A  . Number of Children: 0  . Years of Education: N/A   Occupational History  . consultant    Social History Main Topics  . Smoking status: Never Smoker   . Smokeless tobacco: Never Used  . Alcohol Use: Yes     Comment: daily  . Drug Use: No  . Sexual Activity: Not Asked   Other Topics Concern  . None   Social History Narrative     Current outpatient prescriptions:  .  ALPRAZolam (XANAX) 0.5 MG tablet, One half to one tablet when necessary, Disp: 30 tablet, Rfl: 1 .  aspirin EC 81 MG tablet, Take 81 mg by mouth daily., Disp: , Rfl:  .  fish oil-omega-3 fatty acids 1000 MG capsule, Take 2 g by mouth daily.  , Disp: , Rfl:  .  Probiotic Product (PROBIOTIC PO), Take by mouth daily. OTC    Does not know dosage, Disp: , Rfl:   EXAM:  Filed Vitals:   10/19/15 1459  BP: 122/82  Pulse: 69  Temp: 98.2 F (36.8 C)    Body mass index is 29.19 kg/(m^2).  GENERAL: vitals reviewed and listed above, alert, oriented, appears well hydrated and in no acute distress  HEENT: atraumatic, conjunttiva clear, no obvious abnormalities on inspection of external nose and ears  NECK: no obvious masses on inspection  LUNGS: clear to auscultation bilaterally, no wheezes, rales or rhonchi, good air movement  CV: HRRR, no peripheral edema  MS: moves all extremities without noticeable abnormality  SKIN: small digital mucus cyst r 3rd digit, no signs infection  PSYCH: pleasant and cooperative, no obvious depression or anxiety  ASSESSMENT AND PLAN:  Discussed the following assessment and plan:  Digital mucous cyst  -we discussed possible serious and likely etiologies, workup and treatment options (excision/fusion, cryo, observation, drainage, etc), treatment risks and return precautions -after this discussion, Andrew Mcmahon opted for repeat drainage  -follow up advised as needed if recurs or problems with healing or signs of infection Procedure cyst drainage: -advised of risks and informed consent obtained. Time out performed. Area prepped  with betadine. Cyst punctured with sterile 25 gauge needle and gelatinous slightly blood tinged fluid materials expressed. abx ointment and dressing applied. Tolerated well without complications. Wound care recs and follow up recs provided. -of course, we advised Andrew Mcmahon to return or notify a doctor immediately if symptoms worsen or persist or new concerns arise. -he has asked on both occassions about draining this on his own at home which I advised against -Patient advised to return or notify a doctor immediately if symptoms worsen or persist or new concerns arise.  There are no Patient Instructions on file for this visit.   Colin Benton R.

## 2015-10-19 NOTE — Progress Notes (Signed)
Pre visit review using our clinic review tool, if applicable. No additional management support is needed unless otherwise documented below in the visit note. 

## 2015-10-25 ENCOUNTER — Emergency Department (INDEPENDENT_AMBULATORY_CARE_PROVIDER_SITE_OTHER): Payer: 59

## 2015-10-25 ENCOUNTER — Encounter (HOSPITAL_COMMUNITY): Payer: Self-pay | Admitting: *Deleted

## 2015-10-25 ENCOUNTER — Emergency Department (INDEPENDENT_AMBULATORY_CARE_PROVIDER_SITE_OTHER)
Admission: EM | Admit: 2015-10-25 | Discharge: 2015-10-25 | Disposition: A | Discharge status: Home / Self Care | Payer: 59 | Source: Home / Self Care | Attending: Emergency Medicine | Admitting: Emergency Medicine

## 2015-10-25 DIAGNOSIS — S99911A Unspecified injury of right ankle, initial encounter: Secondary | ICD-10-CM

## 2015-10-25 DIAGNOSIS — S82401A Unspecified fracture of shaft of right fibula, initial encounter for closed fracture: Secondary | ICD-10-CM

## 2015-10-25 MED ORDER — HYDROCODONE-ACETAMINOPHEN 5-325 MG PO TABS: 2.0000 | ORAL_TABLET | ORAL | Status: DC | PRN

## 2015-10-25 NOTE — Discharge Instructions (Signed)
Follow up with Dr. Marcelino Scot, orthopedic surgeon in 3-4 days. Keep it iced, elevated. Use crutches for the first few days and then start using a cane. It would be ideal to be nonweightbearing for comfort on this, at least at first.

## 2015-10-25 NOTE — ED Provider Notes (Signed)
HPI  SUBJECTIVE:  Andrew Mcmahon is a 64 y.o. male who presents with right ankle pain, swelling medial and laterally after stepping on a stick while walking in the woods on a hill earlier today. States that the stick rolled, and he turned his ankle outwards. He denies numbness, tingling, bruising, previous history of injury to this ankle. He denies any other injury to the leg or foot. He states that he is able to bear small amount of weight on it, but came in with a crutch. There are no alleviating factors. Symptoms worse with a weightbearing, palpation. He tried ice, elevation. Past medical history negative for diabetes, hypertension, osteoporosis. Past medical history of abdominal hernia, kidney stones. PMD: Dr. Colin Benton.   Past Medical History  Diagnosis Date  . Back pain     low, hx radiculopathy, saw ortho in the past  . Panic attack   . Kidney stones     history  . PONV (postoperative nausea and vomiting)   . Wears glasses   . 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 Right inguinal hernia     Past Surgical History  Procedure Laterality Date  . Nasal septum surgery    . Colonoscopy    . Mohs surgery    . Mass excision  08/08/2012    Procedure: MINOR EXCISION OF MASS;  Surgeon: Cammie Sickle., MD;  Location: [facility];  Service: Orthopedics;  Laterality: Right;  Minor Excision of Foreign Body    Family History  Problem Relation Age of Onset  . Liver disease Other   . Cancer Father     multiple myeloma, melanoma    Social History  Substance Use Topics  . Smoking status: Never Smoker   . Smokeless tobacco: Never Used  . Alcohol Use: Yes     Comment: daily    No current facility-administered medications for this encounter.  Current outpatient prescriptions:  .  ALPRAZolam (XANAX) 0.5 MG tablet, One half to one tablet when necessary, Disp: 30 tablet, Rfl: 1 .  aspirin EC 81 MG tablet, Take 81 mg by mouth daily., Disp: , Rfl:  .  fish  oil-omega-3 fatty acids 1000 MG capsule, Take 2 g by mouth daily.  , Disp: , Rfl:  .  HYDROcodone-acetaminophen (NORCO/VICODIN) 5-325 MG tablet, Take 2 tablets by mouth every 4 (four) hours as needed for moderate pain., Disp: 20 tablet, Rfl: 0 .  Probiotic Product (PROBIOTIC PO), Take by mouth daily. OTC   Does not know dosage, Disp: , Rfl:   No Known Allergies   ROS  As noted in HPI.   Physical Exam  BP 148/86 mmHg  Pulse 70  Temp(Src) 97.5 F (36.4 C) (Oral)  Resp 16  SpO2 98%  Constitutional: Well developed, well nourished, no acute distress Eyes:  EOMI, conjunctiva normal bilaterally HENT: Normocephalic, atraumatic,mucus membranes moist Respiratory: Normal inspiratory effort Cardiovascular: Normal rate GI: nondistended skin: No rash, skin intact Musculoskeletal: R Ankle Distal fibula  tender, medial and lateral soft tissue swelling, Medial malleolus tender,  Deltoid ligament medially NT, Proximal fibula NT,  Lateral ligaments  tender, ATFL laterally NT, posterior tablofibular ligament laterally NT , Achilles NT, calcaneus  NT,  Proximal 5th metatarsal NT, Midfoot NT, distal NVI with baseline sensation / motor to foot with CR<2 seconds. - bruising. - squeeze test. Pt not able to bear weight in dept. Entire knee nontender.  Neurologic: Alert & oriented x 3, no  focal neuro deficits Psychiatric: Speech and behavior appropriate   ED Course   Medications - No data to display  Orders Placed This Encounter  Procedures  . DG Ankle Complete Right    Standing Status: Standing     Number of Occurrences: 1     Standing Expiration Date:     Order Specific Question:  Reason for Exam (SYMPTOM  OR DIAGNOSIS REQUIRED)    Answer:  fall; ankle pain  . Apply ankle splint    Standing Status: Standing     Number of Occurrences: 1     Standing Expiration Date:     Order Specific Question:  Laterality    Answer:  Right    No results found for this or any previous visit (from the past  24 hour(s)). Dg Ankle Complete Right  10/25/2015  ADDENDUM REPORT: 10/25/2015 19:25 ADDENDUM: Please note a small bony fragment inferior to the medial malleolus appears chronic. An acute avulsion fracture is less likely but not excluded. Clinical correlation is recommended. There is soft tissue swelling over the medial malleolus. Electronically Signed   By: Anner Crete M.D.   On: 10/25/2015 19:25  10/25/2015  CLINICAL DATA:  64 year old male with fall and right ankle pain. EXAM: RIGHT ANKLE - COMPLETE 3+ VIEW COMPARISON:  None. FINDINGS: There is a nondisplaced oblique fracture of distal fibula. No other fracture identified. The ankle mortise is intact. There is soft swelling over the lateral malleolus. IMPRESSION: Nondisplaced oblique fracture of the distal fibula. Electronically Signed: By: Anner Crete M.D. On: 10/25/2015 19:07    ED Clinical Impression  Closed fibular fracture, right, initial encounter  Ankle injury, right, initial encounter   ED Assessment/Plan  Reviewed imaging and discussed with radiology. Nondisplaced oblique fracture of the distal fibula. Bone fragment medially does not appear to be new.  See radiology report for full details. No evidence of injury to the knee, proximal fibula.  Home with ankle stirrup and posterior splint, patient was given another set of crutches suggested nonweightbearing for the next several days, became thereafter, elevation ice, Norco, no NSAIDs given fracture, follow-up with Dr. Marcelino Scot, orthopedic surgeon on call in 3-4 days.  Discussed  imaging, MDM, plan and followup with patient. Discussed sn/sx that should prompt return to the UC or ED. Patient agrees with plan.   *This clinic note was created using Dragon dictation software. Therefore, there may be occasional mistakes despite careful proofreading.  ?   Melynda Ripple, MD 10/25/15 2017

## 2015-10-25 NOTE — ED Notes (Signed)
Reports walking in woods this evening, and didn't see a large branch underneath leaves; rolled right ankle; difficulty bearing weight.

## 2015-10-25 NOTE — ED Notes (Signed)
Ortho tech notified of splint request.

## 2015-10-25 NOTE — Progress Notes (Signed)
Orthopedic Tech Progress Note Patient Details:  Andrew Mcmahon [birth year] [id number] Applied fiberglass posterior short leg splint and fiberglass stirrup splint to RLE.  Pulses, sensation, motion intact before and after splinting.  Capillary refill less than 2 seconds before and after splinting.  Pt. brought his own crutches.  I made sure crutches fit properly and did crutch training with him. Ortho Devices Type of Ortho Device: Short leg splint, Stirrup splint Ortho Device/Splint Location: RLE Ortho Device/Splint Interventions: Application   Darrol Poke 10/25/2015, 9:44 PM

## 2015-10-25 NOTE — ED Notes (Signed)
RLE splint applied per ortho tech.  Ortho tech assessed pt's own crutch size and provided crutch instruction.

## 2016-03-01 ENCOUNTER — Ambulatory Visit: Payer: Self-pay | Admitting: *Deleted

## 2016-03-03 ENCOUNTER — Encounter: Payer: Self-pay | Admitting: *Deleted

## 2016-03-08 ENCOUNTER — Ambulatory Visit (INDEPENDENT_AMBULATORY_CARE_PROVIDER_SITE_OTHER): Payer: Medicare Other | Admitting: *Deleted

## 2016-03-08 DIAGNOSIS — I781 Nevus, non-neoplastic: Secondary | ICD-10-CM | POA: Insufficient documentation

## 2016-03-08 NOTE — Progress Notes (Signed)
   Cutaneous Laser:pulsed mode  590j/cm2 400 ms delay  10 ms Duration 0.5 spot  Total pulses: 463 Total energy 534  Total time::04  Treated facial capillaries on both cheeks, nose and chin. Good rxn from the laser. Tol well. Will follow prn.

## 2016-03-09 ENCOUNTER — Encounter: Payer: Self-pay | Admitting: Vascular Surgery

## 2016-05-23 ENCOUNTER — Ambulatory Visit (INDEPENDENT_AMBULATORY_CARE_PROVIDER_SITE_OTHER): Payer: Medicare Other | Admitting: Family Medicine

## 2016-05-23 ENCOUNTER — Encounter: Payer: Self-pay | Admitting: Family Medicine

## 2016-05-23 VITALS — BP 120/90 | HR 62 | Temp 98.0°F | Ht 71.0 in

## 2016-05-23 DIAGNOSIS — T148 Other injury of unspecified body region: Secondary | ICD-10-CM | POA: Diagnosis not present

## 2016-05-23 DIAGNOSIS — IMO0002 Reserved for concepts with insufficient information to code with codable children: Secondary | ICD-10-CM

## 2016-05-23 NOTE — Progress Notes (Signed)
Pre visit review using our clinic review tool, if applicable. No additional management support is needed unless otherwise documented below in the visit note. Patient declines weight measurement today. 

## 2016-05-23 NOTE — Progress Notes (Signed)
  HPI:  Walk in for finger laceration. R thumb Was cleaning humming bird feeder this morning and sliced it on plastic edge - he immediately washed if with copious amounts of soap and water and hydrogen peroxide. Went to pool and it reopened and bled so re rewashed it and he came here. No bleeding has stopped. UTD on tetanus vaccine.  ROS: See pertinent positives and negatives per HPI.  Past Medical History:  Diagnosis Date  . Back pain    low, hx radiculopathy, saw ortho in the past  . Kidney stones    history  . 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 Panic attack   . PONV (postoperative nausea and vomiting)   . Right inguinal hernia   . Wears glasses     Past Surgical History:  Procedure Laterality Date  . COLONOSCOPY    . MASS EXCISION  08/08/2012   Procedure: MINOR EXCISION OF MASS;  Surgeon: Cammie Sickle., MD;  Location: [facility];  Service: Orthopedics;  Laterality: Right;  Minor Excision of Foreign Body  . MOHS SURGERY    . NASAL SEPTUM SURGERY      Family History  Problem Relation Age of Onset  . Liver disease Other   . Cancer Father     multiple myeloma, melanoma    Social History   Social History  . Marital status: Married    Spouse name: N/A  . Number of children: 0  . Years of education: N/A   Occupational History  . consultant    Social History Main Topics  . Smoking status: Never Smoker  . Smokeless tobacco: Never Used  . Alcohol use Yes     Comment: daily  . Drug use: No  . Sexual activity: Not Asked   Other Topics Concern  . None   Social History Narrative  . None     Current Outpatient Prescriptions:  .  ALPRAZolam (XANAX) 0.5 MG tablet, One half to one tablet when necessary, Disp: 30 tablet, Rfl: 1 .  aspirin EC 81 MG tablet, Take 81 mg by mouth daily., Disp: , Rfl:  .  fish oil-omega-3 fatty acids 1000 MG capsule, Take 2 g by mouth daily.  , Disp: , Rfl:  .  Probiotic Product (PROBIOTIC PO), Take  by mouth daily. OTC   Does not know dosage, Disp: , Rfl:   EXAM:  Vitals:   05/23/16 1147  BP: 120/90  Pulse: 62  Temp: 98 F (36.7 C)    There is no height or weight on file to calculate BMI.  GENERAL: vitals reviewed and listed above, alert, oriented, appears well hydrated and in no acute distress  HEENT: atraumatic, conjunttiva clear, no obvious abnormalities on inspection of external nose and ears  NECK: no obvious masses on inspection  SKIN: small corner skin lac on thumb pad, clean, no signs infection  MS: moves all extremities without noticeable abnormality  PSYCH: pleasant and cooperative, no obvious depression or anxiety  ASSESSMENT AND PLAN:  Discussed the following assessment and plan:  Skin laceration  -cleaned with saline, hemostasis achieved with pressure, steristrip applied -advise to keep wound clean and dry and no pool until healed -wound care and return precautions discussed -Patient advised to return or notify a doctor immediately if symptoms worsen or persist or new concerns arise.  There are no Patient Instructions on file for this visit.  Colin Benton R., DO

## 2016-06-09 ENCOUNTER — Encounter: Payer: Self-pay | Admitting: Gastroenterology

## 2016-06-11 NOTE — Progress Notes (Signed)
Welcome to [employer] Visit  (initial annual wellness or annual wellness exam)  Concerns and/or follow up today:  HLD: -elevated significantly -on fish oil for this -used french press coffee - improved when stopped this  Elevated PSA/Nephrolithiasis: -seeing Dr. Alinda Money in urology in the past, now not in several years per his report -he is unhappy that biopsy was done twice for elevated PSA when he had prostatitis -no symptoms currently, wants to check PSA here  Skin lesion: -left cheek, wants me to check -hx SCC R face  GERD: -2-6 days per week -takes OTC tx randomly for this -does drink several cups of coffee per day -no dysphagia, weight loss  ROS: negative for report of fevers, unintentional weight loss, vision changes, vision loss, hearing loss or change, chest pain, sob, hemoptysis, melena, hematochezia, hematuria, genital discharge or lesions, falls, bleeding or bruising, loc, thoughts of suicide or self harm, memory loss  1.) Patient-completed health risk assessment  - completed and reviewed, see scanned documentation  2.) Review of Medical History: -PMH, PSH, Family History and current specialty and care providers reviewed and updated and listed below  - see scanned in document in chart and below  Past Medical History:  Diagnosis Date  . Back pain    low, hx radiculopathy, saw ortho in the past  . Closed right ankle fracture 09/2015   per pt due to fall while hiking  . Kidney stones    history  . 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 Panic attack   . PONV (postoperative nausea and vomiting)   . Right inguinal hernia   . Wears glasses     Past Surgical History:  Procedure Laterality Date  . COLONOSCOPY    . MASS EXCISION  08/08/2012   Procedure: MINOR EXCISION OF MASS;  Surgeon: Cammie Sickle., MD;  Location: [place];  Service: Orthopedics;  Laterality: Right;  Minor Excision of Foreign Body  . MOHS SURGERY    . NASAL  SEPTUM SURGERY      Social History   Social History  . Marital status: Married    Spouse name: N/A  . Number of children: 0  . Years of education: N/A   Occupational History  . consultant    Social History Main Topics  . Smoking status: Never Smoker  . Smokeless tobacco: Never Used  . Alcohol use Yes     Comment: daily  . Drug use: No  . Sexual activity: Not on file   Other Topics Concern  . Not on file   Social History Narrative  . No narrative on file    Family History  Problem Relation Age of Onset  . Liver disease Other   . Multiple myeloma Father   . Melanoma Father     Current Outpatient Prescriptions on File Prior to Visit  Medication Sig Dispense Refill  . ALPRAZolam (XANAX) 0.5 MG tablet One half to one tablet when necessary 30 tablet 1  . aspirin EC 81 MG tablet Take 81 mg by mouth daily.    . fish oil-omega-3 fatty acids 1000 MG capsule Take 2 g by mouth daily.      . Probiotic Product (PROBIOTIC PO) Take by mouth daily. OTC   Does not know dosage     No current facility-administered medications on file prior to visit.      3.) Review of functional ability and level of safety:  Any difficulty hearing?  NO  History of falling? Yes, mechanical fall, no gait or balance concerns  Any trouble with IADLs - using a phone, using transportation, grocery shopping, preparing meals, doing housework, doing laundry, taking medications and managing money? NO  Advance Directives? NO  See summary of recommendations in Patient Instructions below.  4.) Physical Exam Vitals:   06/12/16 0812  BP: 118/80  Pulse: 62  Temp: 98.6 F (37 C)   Estimated body mass index is 29.56 kg/m as calculated from the following:   Height as of this encounter: '5\' 10"'  (1.778 m).   Weight as of this encounter: 206 lb (93.4 kg).  EKG (optional): deferred  General: alert, appear well hydrated and in no acute distress  HEENT: visual acuity grossly intact  CV: HRRR, no  peripheral edema  Lungs: CTA bilaterally  Psych: pleasant and cooperative, no obvious depression or anxiety  Cognitive function grossly intact  SKIN: scaly area of skin on L cheek  See patient instructions for recommendations.  Education and counseling regarding the above review of health provided with a plan for the following: -see scanned patient completed form for further details -fall prevention strategies discussed  -healthy lifestyle discussed -importance and resources for completing advanced directives discussed -see patient instructions below for any other recommendations provided  4)The following written screening schedule of preventive measures were reviewed with assessment and plan made per below, orders and patient instructions:      AAA screening done if applicable     Alcohol screening done     Obesity Screening and counseling done     STI screening (Hep C if born 1945-65) offered and per pt wishes     Tobacco Screening done done       Pneumococcal (PPSV23 -one dose after 64, one before if risk factors), influenza yearly and hepatitis B vaccines (if high risk - end stage renal disease, IV drugs, homosexual men, live in home for mentally retarded, hemophilia receiving factors) ASSESSMENT/PLAN: Prevnar 13 today      Prostate cancer screening ASSESSMENT/PLAN: after discussion risks/benefits agreed to PSA, no symptoms, DRE deferred      Colorectal cancer screening (FOBT yearly or flex sig q4y or colonoscopy q10y or barium enema q4y) ASSESSMENT/PLAN: done 07/2006 with recs to repeat in 10 years - he is arranging on his own, agrees to call if decides to do cologuard or needs referral      Diabetes outpatient self-management training services ASSESSMENT/PLAN: utd or done      Bone mass measurements(covered q2y if indicated - estrogen def, osteoporosis, hyperparathyroid, vertebral abnormalities, osteoporosis or steroids) ASSESSMENT/PLAN: utd or discussed and ordered per  pt wishes      Screening for glaucoma(q1y if high risk - diabetes, FH, AA and > 50 or hispanic and > 65) ASSESSMENT/PLAN: utd or advised      Medical nutritional therapy for individuals with diabetes or renal disease ASSESSMENT/PLAN: see orders      Cardiovascular screening blood tests (lipids q5y) ASSESSMENT/PLAN: see orders and labs      Diabetes screening tests ASSESSMENT/PLAN: see orders and labs   7.) Summary:   Initial Medicare annual wellness visit - Plan: Hep C Antibody -risk factors and conditions per above assessment were discussed and treatment, recommendations and referrals were offered per documentation above and orders and patient instructions. -see scanned portion of exam  AK (actinic keratosis) -discussed tx options and he decided to do LN2, freeze thaw x3, tolerated well, follow up for recheck in 1-3 months advised  Gastroesophageal reflux disease  without esophagitis -discussed tx options -lifestyle recs provided -low dose PPI for 2 weeks, then cont if needed -follow up in 3 months advised  Hyperlipemia Screening - Plan: Lipid Panel  Hyperglycemia Screening - Plan: Hemoglobin A1c  Elevated PSA - Plan: PSA -discussed risks, limitations testing. He wants to recheck PSA.  Need for prophylactic vaccination against Streptococcus pneumoniae (pneumococcus) - Plan: Pneumococcal conjugate vaccine 13-valent  Patient Instructions  BEFORE YOU LEAVE: -liquid nitrogen -pneumonia vaccine -cologuard information -labs -follow up: in 3 months and as needed  Start nexium low dose over the counter daily for the reflux for 2 weeks.   Let us know if you wish to do the cologuard test or need a referral for the colonoscopy.  We recommend the following healthy lifestyle: 1) Small portions - eat off of salad plate instead of dinner plate 2) Eat a healthy clean diet with avoidance of (less then 1 serving per week) processed foods, sweetened drinks, white starches, red  meat, fast foods and sweets and consisting of: * 5-9 servings per day of fresh or frozen fruits and vegetables (not corn or potatoes, not dried or canned) *nuts and seeds, beans *olives and olive oil *small portions of lean meats such as fish and white chicken  *small portions of whole grains 3)Get at least 150 minutes of sweaty aerobic exercise per week 4)reduce stress - counseling, meditation, relaxation to balance other aspects of your life  We have ordered labs or studies at this visit. It can take up to 1-2 weeks for results and processing. IF results require follow up or explanation, we will call you with instructions. Clinically stable results will be released to your [facility]. If you have not heard from [place] or cannot find your results in [facility] in 2 weeks please contact our office at [phone].  If you are not yet signed up for [facility], please consider signing up.   Food Choices for Gastroesophageal Reflux Disease, Adult When you have gastroesophageal reflux disease (GERD), the foods you eat and your eating habits are very important. Choosing the right foods can help ease the discomfort of GERD. WHAT GENERAL GUIDELINES DO I NEED TO FOLLOW?  Choose fruits, vegetables, whole grains, low-fat dairy products, and low-fat meat, fish, and poultry.  Limit fats such as oils, salad dressings, butter, nuts, and avocado.  Keep a food diary to identify foods that cause symptoms.  Avoid foods that cause reflux. These may be different for different people.  Eat frequent small meals instead of three large meals each day.  Eat your meals slowly, in a relaxed setting.  Limit fried foods.  Cook foods using methods other than frying.  Avoid drinking alcohol.  Avoid drinking large amounts of liquids with your meals.  Avoid bending over or lying down until 2-3 hours after eating. WHAT FOODS ARE NOT RECOMMENDED? The following are some foods and drinks that may worsen your  symptoms: Vegetables Tomatoes. Tomato juice. Tomato and spaghetti sauce. Chili peppers. Onion and garlic. Horseradish. Fruits Oranges, grapefruit, and lemon (fruit and juice). Meats High-fat meats, fish, and poultry. This includes hot dogs, ribs, ham, sausage, salami, and bacon. Dairy Whole milk and chocolate milk. Sour cream. Cream. Butter. Ice cream. Cream cheese.  Beverages Coffee and tea, with or without caffeine. Carbonated beverages or energy drinks. Condiments Hot sauce. Barbecue sauce.  Sweets/Desserts Chocolate and cocoa. Donuts. Peppermint and spearmint. Fats and Oils High-fat foods, including [place] fries and potato chips. Other Vinegar. Strong spices, such as black pepper, white pepper,  red pepper, cayenne, curry powder, cloves, ginger, and chili powder. The items listed above may not be a complete list of foods and beverages to avoid. Contact your dietitian for more information.   This information is not intended to replace advice given to you by your health care provider. Make sure you discuss any questions you have with your health care provider.   Document Released: 10/16/2005 Document Revised: 11/06/2014 Document Reviewed: 08/20/2013 Elsevier Interactive Patient Education 2016 [place]., DO

## 2016-06-12 ENCOUNTER — Ambulatory Visit (INDEPENDENT_AMBULATORY_CARE_PROVIDER_SITE_OTHER): Payer: Medicare Other | Admitting: Family Medicine

## 2016-06-12 ENCOUNTER — Encounter: Payer: Self-pay | Admitting: Family Medicine

## 2016-06-12 ENCOUNTER — Encounter: Payer: 59 | Admitting: Family Medicine

## 2016-06-12 VITALS — BP 118/80 | HR 62 | Temp 98.6°F | Ht 70.0 in | Wt 206.0 lb

## 2016-06-12 DIAGNOSIS — Z23 Encounter for immunization: Secondary | ICD-10-CM

## 2016-06-12 DIAGNOSIS — L570 Actinic keratosis: Secondary | ICD-10-CM

## 2016-06-12 DIAGNOSIS — Z0000 Encounter for general adult medical examination without abnormal findings: Secondary | ICD-10-CM | POA: Diagnosis not present

## 2016-06-12 DIAGNOSIS — K219 Gastro-esophageal reflux disease without esophagitis: Secondary | ICD-10-CM

## 2016-06-12 DIAGNOSIS — E785 Hyperlipidemia, unspecified: Secondary | ICD-10-CM

## 2016-06-12 DIAGNOSIS — R972 Elevated prostate specific antigen [PSA]: Secondary | ICD-10-CM

## 2016-06-12 DIAGNOSIS — R739 Hyperglycemia, unspecified: Secondary | ICD-10-CM

## 2016-06-12 LAB — LIPID PANEL
CHOL/HDL RATIO: 5
Cholesterol: 214 mg/dL — ABNORMAL HIGH (ref 0–200)
HDL: 43.2 mg/dL (ref >39.00)
LDL Cholesterol: 146 mg/dL — ABNORMAL HIGH (ref 0–99)
NONHDL: 171.21
Triglycerides: 128 mg/dL (ref 0.0–149.0)
VLDL: 25.6 mg/dL (ref 0.0–40.0)

## 2016-06-12 LAB — HEMOGLOBIN A1C: Hgb A1c MFr Bld: 5.3 % (ref 4.6–6.5)

## 2016-06-12 LAB — PSA: PSA: 2.96 ng/mL (ref 0.10–4.00)

## 2016-06-12 NOTE — Patient Instructions (Signed)
BEFORE YOU LEAVE: -liquid nitrogen -pneumonia vaccine -cologuard information -labs -follow up: in 3 months and as needed  Start nexium low dose over the counter daily for the reflux for 2 weeks.   Let us know if you wish to do the cologuard test or need a referral for the colonoscopy.  We recommend the following healthy lifestyle: 1) Small portions - eat off of salad plate instead of dinner plate 2) Eat a healthy clean diet with avoidance of (less then 1 serving per week) processed foods, sweetened drinks, white starches, red meat, fast foods and sweets and consisting of: * 5-9 servings per day of fresh or frozen fruits and vegetables (not corn or potatoes, not dried or canned) *nuts and seeds, beans *olives and olive oil *small portions of lean meats such as fish and white chicken  *small portions of whole grains 3)Get at least 150 minutes of sweaty aerobic exercise per week 4)reduce stress - counseling, meditation, relaxation to balance other aspects of your life  We have ordered labs or studies at this visit. It can take up to 1-2 weeks for results and processing. IF results require follow up or explanation, we will call you with instructions. Clinically stable results will be released to your [facility]. If you have not heard from [place] or cannot find your results in [facility] in 2 weeks please contact our office at [phone].  If you are not yet signed up for [facility], please consider signing up.   Food Choices for Gastroesophageal Reflux Disease, Adult When you have gastroesophageal reflux disease (GERD), the foods you eat and your eating habits are very important. Choosing the right foods can help ease the discomfort of GERD. WHAT GENERAL GUIDELINES DO I NEED TO FOLLOW?  Choose fruits, vegetables, whole grains, low-fat dairy products, and low-fat meat, fish, and poultry.  Limit fats such as oils, salad dressings, butter, nuts, and avocado.  Keep a food diary to identify foods that  cause symptoms.  Avoid foods that cause reflux. These may be different for different people.  Eat frequent small meals instead of three large meals each day.  Eat your meals slowly, in a relaxed setting.  Limit fried foods.  Cook foods using methods other than frying.  Avoid drinking alcohol.  Avoid drinking large amounts of liquids with your meals.  Avoid bending over or lying down until 2-3 hours after eating. WHAT FOODS ARE NOT RECOMMENDED? The following are some foods and drinks that may worsen your symptoms: Vegetables Tomatoes. Tomato juice. Tomato and spaghetti sauce. Chili peppers. Onion and garlic. Horseradish. Fruits Oranges, grapefruit, and lemon (fruit and juice). Meats High-fat meats, fish, and poultry. This includes hot dogs, ribs, ham, sausage, salami, and bacon. Dairy Whole milk and chocolate milk. Sour cream. Cream. Butter. Ice cream. Cream cheese.  Beverages Coffee and tea, with or without caffeine. Carbonated beverages or energy drinks. Condiments Hot sauce. Barbecue sauce.  Sweets/Desserts Chocolate and cocoa. Donuts. Peppermint and spearmint. Fats and Oils High-fat foods, including [place] fries and potato chips. Other Vinegar. Strong spices, such as black pepper, white pepper, red pepper, cayenne, curry powder, cloves, ginger, and chili powder. The items listed above may not be a complete list of foods and beverages to avoid. Contact your dietitian for more information.   This information is not intended to replace advice given to you by your health care provider. Make sure you discuss any questions you have with your health care provider.   Document Released: 10/16/2005 Document Revised: 11/06/2014 Document Reviewed:  08/20/2013 Elsevier Interactive Patient Education [employer].

## 2016-06-12 NOTE — Progress Notes (Signed)
Pre visit review using our clinic review tool, if applicable. No additional management support is needed unless otherwise documented below in the visit note. 

## 2016-06-13 LAB — HEPATITIS C ANTIBODY: HCV Ab: NEGATIVE

## 2016-09-12 ENCOUNTER — Ambulatory Visit: Payer: Medicare Other | Admitting: Family Medicine

## 2016-09-15 ENCOUNTER — Encounter: Payer: Self-pay | Admitting: Family Medicine

## 2016-09-15 ENCOUNTER — Ambulatory Visit (INDEPENDENT_AMBULATORY_CARE_PROVIDER_SITE_OTHER): Payer: Medicare Other | Admitting: Family Medicine

## 2016-09-15 VITALS — BP 120/80 | HR 62 | Temp 98.0°F | Ht 71.0 in | Wt 205.6 lb

## 2016-09-15 DIAGNOSIS — K219 Gastro-esophageal reflux disease without esophagitis: Secondary | ICD-10-CM

## 2016-09-15 DIAGNOSIS — E785 Hyperlipidemia, unspecified: Secondary | ICD-10-CM

## 2016-09-15 DIAGNOSIS — Z1211 Encounter for screening for malignant neoplasm of colon: Secondary | ICD-10-CM | POA: Diagnosis not present

## 2016-09-15 DIAGNOSIS — Z6828 Body mass index (BMI) 28.0-28.9, adult: Secondary | ICD-10-CM | POA: Diagnosis not present

## 2016-09-15 NOTE — Patient Instructions (Addendum)
BEFORE YOU LEAVE: -make sure cologuard ordered for colon cancer screening -follow up: Medicare exam in August 2018  Continue current treatment of the acid reflux.   We recommend the following healthy lifestyle for LIFE: 1) Small portions.   Tip: eat off of a salad plate instead of a dinner plate.  Tip: if you need more or a snack choose fruits, veggies and/or a handful of nuts or seeds.  2) Eat a healthy clean diet.  * Tip: Avoid (less then 1 serving per week): processed foods, sweets, sweetened drinks, white starches (rice, flour, bread, potatoes, pasta, etc), red meat, fast foods, butter  *Tip: CHOOSE instead   * 5-9 servings per day of fresh or frozen fruits and vegetables (but not corn, potatoes, bananas, canned or dried fruit)   *nuts and seeds, beans   *olives and olive oil   *small portions of lean meats such as fish and white chicken    *small portions of whole grains  3)Get at least 150 minutes of sweaty aerobic exercise per week.  4)Reduce stress - consider counseling, meditation and relaxation to balance other aspects of your life.

## 2016-09-15 NOTE — Progress Notes (Signed)
Pre visit review using our clinic review tool, if applicable. No additional management support is needed unless otherwise documented below in the visit note. 

## 2016-09-15 NOTE — Progress Notes (Signed)
HPI:  Follow up GERD, BMI 28 and HLD. Reports that the Nexium, over-the-counter dose, worked great for his acid reflux symptoms. As long as he takes this he does not have heartburn. However if he stops it, he does develop heartburn greater than 2 times per week. He feels his diet is healthy, he has followed the dietary recommendations and eats small meals. He exercises on a regular basis, greater than 150 minutes per week. He has a little concerned about side effects to Nexium so has spaced out his dosing to every 3 days which does control his symptoms. He does not want to take a cholesterol-lowering medication. He wishes to continue a healthy lifestyle. He does not have any significant family history of cardiovascular disease or any other significant cardiovascular risk factors. He asked about colon cancer screening. He has more questions about the Cologuard, and he would like to go ahead and order this. ROS: See pertinent positives and negatives per HPI.  Past Medical History:  Diagnosis Date  . Back pain    low, hx radiculopathy, saw ortho in the past  . Closed right ankle fracture 09/2015   per pt due to fall while hiking  . Kidney stones    history  . 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 Panic attack   . PONV (postoperative nausea and vomiting)   . Right inguinal hernia   . Wears glasses     Past Surgical History:  Procedure Laterality Date  . COLONOSCOPY    . MASS EXCISION  08/08/2012   Procedure: MINOR EXCISION OF MASS;  Surgeon: Cammie Sickle., MD;  Location: [place];  Service: Orthopedics;  Laterality: Right;  Minor Excision of Foreign Body  . MOHS SURGERY    . NASAL SEPTUM SURGERY      Family History  Problem Relation Age of Onset  . Liver disease Other   . Multiple myeloma Father   . Melanoma Father     Social History   Social History  . Marital status: Married    Spouse name: N/A  . Number of children: 0  . Years of  education: N/A   Occupational History  . consultant    Social History Main Topics  . Smoking status: Never Smoker  . Smokeless tobacco: Never Used  . Alcohol use Yes     Comment: daily  . Drug use: No  . Sexual activity: Not Asked   Other Topics Concern  . None   Social History Narrative  . None     Current Outpatient Prescriptions:  .  ALPRAZolam (XANAX) 0.5 MG tablet, One half to one tablet when necessary, Disp: 30 tablet, Rfl: 1 .  aspirin EC 81 MG tablet, Take 81 mg by mouth daily., Disp: , Rfl:  .  Esomeprazole Magnesium (NEXIUM PO), Take by mouth daily., Disp: , Rfl:  .  fish oil-omega-3 fatty acids 1000 MG capsule, Take 2 g by mouth daily.  , Disp: , Rfl:  .  Probiotic Product (PROBIOTIC PO), Take by mouth daily. OTC   Does not know dosage, Disp: , Rfl:   EXAM:  Vitals:   09/15/16 0949  BP: 120/80  Pulse: 62  Temp: 98 F (36.7 C)    Body mass index is 28.68 kg/m.  GENERAL: vitals reviewed and listed above, alert, oriented, appears well hydrated and in no acute distress  HEENT: atraumatic, conjunttiva clear, no obvious abnormalities on inspection of external nose and  ears  NECK: no obvious masses on inspection  LUNGS: clear to auscultation bilaterally, no wheezes, rales or rhonchi, good air movement  CV: HRRR, no peripheral edema  MS: moves all extremities without noticeable abnormality  PSYCH: pleasant and cooperative, no obvious depression or anxiety  ASSESSMENT AND PLAN:  Discussed the following assessment and plan:  Gastroesophageal reflux disease without esophagitis  Hyperlipidemia, unspecified hyperlipidemia type  BMI 28.0-28.9,adult  Colon cancer screening  Discussed risks and benefits of various options for the treatment of acid reflux and he has opted to continue with using a low dose PPI a few days per week. I did advise that he try a trial completely off the PPI several months. We discussed lifestyle changes at length. We also  discussed various treatment options for elevated cholesterol, options for reduction of cardiovascular risks and his specific risks. He would like to avoid a medication and has decided to continue a healthy lifestyle without the addition of a medication for this. He did see an improvement in the cholesterol with elimination of unfiltered coffee. We discussed colon cancer screening options and he would like to do the Cologuard test. Advised my assistant to check into Cologuard screening and order this for him. -Patient advised to return or notify a doctor immediately if symptoms worsen or persist or new concerns arise.  Patient Instructions  BEFORE YOU LEAVE: -make sure cologuard ordered for colon cancer screening -follow up: Medicare exam in August 2018  Continue current treatment of the acid reflux.   We recommend the following healthy lifestyle for LIFE: 1) Small portions.   Tip: eat off of a salad plate instead of a dinner plate.  Tip: if you need more or a snack choose fruits, veggies and/or a handful of nuts or seeds.  2) Eat a healthy clean diet.  * Tip: Avoid (less then 1 serving per week): processed foods, sweets, sweetened drinks, white starches (rice, flour, bread, potatoes, pasta, etc), red meat, fast foods, butter  *Tip: CHOOSE instead   * 5-9 servings per day of fresh or frozen fruits and vegetables (but not corn, potatoes, bananas, canned or dried fruit)   *nuts and seeds, beans   *olives and olive oil   *small portions of lean meats such as fish and white chicken    *small portions of whole grains  3)Get at least 150 minutes of sweaty aerobic exercise per week.  4)Reduce stress - consider counseling, meditation and relaxation to balance other aspects of your life.     Colin Benton R., DO

## 2016-10-12 DIAGNOSIS — Z1212 Encounter for screening for malignant neoplasm of rectum: Secondary | ICD-10-CM | POA: Diagnosis not present

## 2016-10-12 DIAGNOSIS — Z1211 Encounter for screening for malignant neoplasm of colon: Secondary | ICD-10-CM | POA: Diagnosis not present

## 2016-10-12 LAB — COLOGUARD: Cologuard: NEGATIVE

## 2016-10-26 ENCOUNTER — Encounter: Payer: Self-pay | Admitting: Family Medicine

## 2016-10-26 ENCOUNTER — Telehealth: Payer: Self-pay | Admitting: Family Medicine

## 2016-10-26 NOTE — Telephone Encounter (Signed)
I called the pt and informed him the Cologuard test was negative and this should be repeated in 3 years.

## 2016-10-26 NOTE — Telephone Encounter (Signed)
Pt returned the call not sure who it may have been think it is about results not really sure.

## 2016-11-03 ENCOUNTER — Other Ambulatory Visit: Payer: Self-pay | Admitting: Surgery

## 2016-11-03 DIAGNOSIS — K4090 Unilateral inguinal hernia, without obstruction or gangrene, not specified as recurrent: Secondary | ICD-10-CM | POA: Diagnosis not present

## 2016-11-16 ENCOUNTER — Ambulatory Visit: Payer: Medicare Other | Admitting: Family Medicine

## 2016-11-20 ENCOUNTER — Encounter: Payer: Self-pay | Admitting: Family Medicine

## 2016-11-20 ENCOUNTER — Ambulatory Visit (INDEPENDENT_AMBULATORY_CARE_PROVIDER_SITE_OTHER): Payer: Medicare Other | Admitting: Family Medicine

## 2016-11-20 VITALS — BP 118/80 | HR 78 | Temp 98.0°F | Ht 71.0 in | Wt 206.2 lb

## 2016-11-20 DIAGNOSIS — Z7189 Other specified counseling: Secondary | ICD-10-CM

## 2016-11-20 DIAGNOSIS — Z7185 Encounter for immunization safety counseling: Secondary | ICD-10-CM

## 2016-11-20 DIAGNOSIS — Z23 Encounter for immunization: Secondary | ICD-10-CM

## 2016-11-20 DIAGNOSIS — L570 Actinic keratosis: Secondary | ICD-10-CM

## 2016-11-20 NOTE — Patient Instructions (Addendum)
BEFORE YOU LEAVE: -follow up: 1 month -flu shot

## 2016-11-20 NOTE — Progress Notes (Signed)
HPI:  Her for several concerns. A scaly place on L cheek for some time. Hx NMSC and wants LN2 tx. Also wants my take on the flu vaccine and wonders if he should get the flu shot.  ROS: See pertinent positives and negatives per HPI.  Past Medical History:  Diagnosis Date  . Back pain    low, hx radiculopathy, saw ortho in the past  . Closed right ankle fracture 09/2015   per pt due to fall while hiking  . Kidney stones    history  . 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 Panic attack   . PONV (postoperative nausea and vomiting)   . Right inguinal hernia   . Wears glasses     Past Surgical History:  Procedure Laterality Date  . COLONOSCOPY    . MASS EXCISION  08/08/2012   Procedure: MINOR EXCISION OF MASS;  Surgeon: Cammie Sickle., MD;  Location: [facility];  Service: Orthopedics;  Laterality: Right;  Minor Excision of Foreign Body  . MOHS SURGERY    . NASAL SEPTUM SURGERY      Family History  Problem Relation Age of Onset  . Liver disease Other   . Multiple myeloma Father   . Melanoma Father     Social History   Social History  . Marital status: Married    Spouse name: N/A  . Number of children: 0  . Years of education: N/A   Occupational History  . consultant    Social History Main Topics  . Smoking status: Never Smoker  . Smokeless tobacco: Never Used  . Alcohol use Yes     Comment: daily  . Drug use: No  . Sexual activity: Not Asked   Other Topics Concern  . None   Social History Narrative  . None     Current Outpatient Prescriptions:  .  ALPRAZolam (XANAX) 0.5 MG tablet, One half to one tablet when necessary (Patient taking differently: Take 0.25 mg by mouth daily as needed for anxiety. ), Disp: 30 tablet, Rfl: 1 .  aspirin EC 81 MG tablet, Take 81 mg by mouth daily., Disp: , Rfl:  .  esomeprazole (NEXIUM) 20 MG capsule, Take 20 mg by mouth every other day., Disp: , Rfl:  .  fish oil-omega-3 fatty acids 1000 MG  capsule, Take 1 g by mouth daily. , Disp: , Rfl:  .  naproxen sodium (ANAPROX) 220 MG tablet, Take 440 mg by mouth as needed (pain). , Disp: , Rfl:  .  Probiotic Product (PROBIOTIC PO), Take 1 capsule by mouth daily. , Disp: , Rfl:   EXAM:  Vitals:   11/20/16 1449  BP: 118/80  Pulse: 78  Temp: 98 F (36.7 C)    Body mass index is 28.76 kg/m.  GENERAL: vitals reviewed and listed above, alert, oriented, appears well hydrated and in no acute distress  HEENT: atraumatic, conjunttiva clear, no obvious abnormalities on inspection of external nose and ears  NECK: no obvious masses on inspection  SKIN: very tiny 2 mm hyperkeratotic sl scaly area L cheek; larger hyperkeratotic scaly lesion L ear approx 3x37m  MS: moves all extremities without noticeable abnormality  PSYCH: pleasant and cooperative, no obvious depression or anxiety  ASSESSMENT AND PLAN:  Discussed the following assessment and plan:  Actinic keratosis  Encounter for immunization - Plan: Flu vaccine HIGH DOSE PF  Vaccine counseling  -acute visit for AKs and vaccine counseling -after discussion  sig, risks, tx options he decided to do LN2 x2, tolerated well -follow up 1 month to recheck -discuss risks, benefits, indications, CDC guidelines for flu vaccine, he opted to get today -Patient advised to return or notify a doctor immediately if symptoms worsen or persist or new concerns arise.  Patient Instructions  BEFORE YOU LEAVE: -follow up: 1 month -flu shot    KIM, Jarrett Soho R., DO

## 2016-11-20 NOTE — Progress Notes (Signed)
Pre visit review using our clinic review tool, if applicable. No additional management support is needed unless otherwise documented below in the visit note. 

## 2016-11-22 NOTE — Pre-Procedure Instructions (Signed)
KALAB FIDEL  11/22/2016      [id number] - [place], [place] - [place] [place] [address] Phone: [phone] Fax: [phone]    Your procedure is scheduled on Wed, Jan 31 @ 8:30 AM  Report to [facility] Admitting at 6:30 AM  Call this number if you have problems the morning of surgery:  [phone]   Remember:  Do not eat food or drink liquids after midnight.  Take these medicines the morning of surgery with A SIP OF WATER Alprazolam(Xanax) and Nexium(Esomeprazole)               Stop taking your Fish Oil,Aspirin,Naproxen along with any Vitamins or Herbal Medications.    Do not wear jewelry.  Do not wear lotions, powders,colognes, or deoderant.  Men may shave face and neck.  Do not bring valuables to the hospital.  [facility] is not responsible for any belongings or valuables.  Contacts, dentures or bridgework may not be worn into surgery.  Leave your suitcase in the car.  After surgery it may be brought to your room.  For patients admitted to the hospital, discharge time will be determined by your treatment team.  Patients discharged the day of surgery will not be allowed to drive home.    [facility]  Before surgery, you can play an important role.  Because skin is not sterile, your skin needs to be as free of germs as possible.  You can reduce the number of germs on you skin by washing with CHG (chlorahexidine gluconate) soap before surgery.  CHG is an antiseptic cleaner which kills germs and bonds with the skin to continue killing germs even after washing.  Please DO NOT use if you have an allergy to CHG or antibacterial soaps.  If your skin becomes reddened/irritated stop using the CHG and inform your nurse when you arrive at Short Stay.  Do not shave (including legs and underarms) for at least 48 hours prior to the first CHG shower.  You may shave  your face.  Please follow these instructions carefully:   1.  Shower with CHG Soap the night before surgery and the                                morning of Surgery.  2.  If you choose to wash your hair, wash your hair first as usual with your       normal shampoo.  3.  After you shampoo, rinse your hair and body thoroughly to remove the                      Shampoo.  4.  Use CHG as you would any other liquid soap.  You can apply chg directly       to the skin and wash gently with scrungie or a clean washcloth.  5.  Apply the CHG Soap to your body ONLY FROM THE NECK DOWN.        Do not use on open wounds or open sores.  Avoid contact with your eyes,       ears, mouth and genitals (private parts).  Wash genitals (private parts)       with your normal soap.  6.  Wash thoroughly, paying special attention to the area where your surgery  will be performed.  7.  Thoroughly rinse your body with warm water from the neck down.  8.  DO NOT shower/wash with your normal soap after using and rinsing off       the CHG Soap.  9.  Pat yourself dry with a clean towel.            10.  Wear clean pajamas.            11.  Place clean sheets on your bed the night of your first shower and do not        sleep with pets.  Day of Surgery  Do not apply any lotions/deoderants the morning of surgery.  Please wear clean clothes to the hospital/surgery center.     Please read over the following fact sheets that you were given. Pain Booklet, Coughing and Deep Breathing and Surgical Site Infection Prevention

## 2016-11-23 ENCOUNTER — Encounter (HOSPITAL_COMMUNITY): Payer: Self-pay

## 2016-11-23 ENCOUNTER — Encounter (HOSPITAL_COMMUNITY)
Admission: RE | Admit: 2016-11-23 | Discharge: 2016-11-23 | Disposition: A | Discharge status: Home / Self Care | Payer: Medicare Other | Source: Ambulatory Visit | Attending: Surgery | Admitting: Surgery

## 2016-11-23 DIAGNOSIS — K4090 Unilateral inguinal hernia, without obstruction or gangrene, not specified as recurrent: Secondary | ICD-10-CM | POA: Insufficient documentation

## 2016-11-23 DIAGNOSIS — Z01812 Encounter for preprocedural laboratory examination: Secondary | ICD-10-CM | POA: Insufficient documentation

## 2016-11-23 HISTORY — DX: Gastro-esophageal reflux disease without esophagitis: K21.9

## 2016-11-23 HISTORY — DX: Deviated nasal septum: J34.2

## 2016-11-23 HISTORY — DX: Personal history of urinary calculi: Z87.442

## 2016-11-23 LAB — BASIC METABOLIC PANEL
Anion gap: 6 (ref 5–15)
BUN: 13 mg/dL (ref 6–20)
CHLORIDE: 109 mmol/L (ref 101–111)
CO2: 27 mmol/L (ref 22–32)
CREATININE: 1.11 mg/dL (ref 0.61–1.24)
Calcium: 9.5 mg/dL (ref 8.9–10.3)
GFR calc Af Amer: >60 mL/min (ref >60)
GFR calc non Af Amer: >60 mL/min (ref >60)
Glucose, Bld: 98 mg/dL (ref 65–99)
Potassium: 4.3 mmol/L (ref 3.5–5.1)
SODIUM: 142 mmol/L (ref 135–145)

## 2016-11-23 LAB — CBC
HCT: 46.4 % (ref 39.0–52.0)
Hemoglobin: 16 g/dL (ref 13.0–17.0)
MCH: 31.6 pg (ref 26.0–34.0)
MCHC: 34.5 g/dL (ref 30.0–36.0)
MCV: 91.5 fL (ref 78.0–100.0)
Platelets: 165 10*3/uL (ref 150–400)
RBC: 5.07 MIL/uL (ref 4.22–5.81)
RDW: 13 % (ref 11.5–15.5)
WBC: 6.4 10*3/uL (ref 4.0–10.5)

## 2016-11-28 NOTE — H&P (Signed)
Andrew Mcmahon 9:34 AM Location: [facility] Patient #: [id number] DOB: May 15, 1951 Married / Language: Andrew Mcmahon / Race: White Male   History of Present Illness (Andrew Mcmahon A. Ninfa Linden MD; Mcmahon 10:03 AM) The patient is a 66 year old male who presents with an inguinal hernia. This is a pleasant gentleman last seen in the past for a right inguinal hernia. He had been minimally symptomatic from it that has been getting larger. We had scheduled him for laparoscopic repair but we had to cancel her for insurance reasons. His plans to change from an insurance standpoint and he is ready to proceed with with hernia surgery. He reports he is having more discomfort from this.    Past Surgical History Andrew Mcmahon, [place]; Mcmahon 9:34 AM) No pertinent past surgical history   Allergies Andrew Mcmahon, [place]; Mcmahon 9:36 AM) No Known Drug Allergies 11/03/2016  Medication History Andrew Mcmahon, CMA; Mcmahon 9:38 AM) Aspirin (81MG  Tablet, Oral) Active. Fish Oil + D3 (1000-1000MG -UNIT Capsule, Oral) Active. Probiotic Formula (Oral) Active. NexIUM (Oral) Specific strength unknown - Active. Medications Reconciled  Social History Andrew Mcmahon, CMA; Mcmahon 9:34 AM) Alcohol use  Moderate alcohol use. Illicit drug use  Uses socially only. Tobacco use  Never smoker.  Family History Andrew Mcmahon, Andrew Mcmahon; Mcmahon 9:34 AM) Melanoma  Brother, Father. Prostate Cancer  Family Members In General, Father.  Other Problems Andrew Mcmahon, CMA; Mcmahon 9:34 AM) Enlarged Prostate  Kidney Stone     Review of Systems Andrew Mcmahon CMA; Mcmahon 9:34 AM) General Not Present- Appetite Loss, Chills, Fatigue, Fever, Night Sweats, Weight Gain and Weight Loss. Skin Not Present- Change in Wart/Mole, Dryness, Hives, Jaundice, New Lesions, Non-Healing Wounds, Rash and Ulcer. HEENT Not Present- Earache, Hearing Loss, Hoarseness, Nose Bleed, Oral Ulcers, Ringing in the Ears,  Seasonal Allergies, Sinus Pain, Sore Throat, Visual Disturbances, Wears glasses/contact lenses and Yellow Eyes. Breast Not Present- Breast Mass, Breast Pain, Nipple Discharge and Skin Changes. Cardiovascular Not Present- Chest Pain, Difficulty Breathing Lying Down, Leg Cramps, Palpitations, Rapid Heart Rate, Shortness of Breath and Swelling of Extremities. Gastrointestinal Not Present- Abdominal Pain, Bloating, Bloody Stool, Change in Bowel Habits, Chronic diarrhea, Constipation, Difficulty Swallowing, Excessive gas, Gets full quickly at meals, Hemorrhoids, Indigestion, Nausea, Rectal Pain and Vomiting. Male Genitourinary Not Present- Blood in Urine, Change in Urinary Stream, Frequency, Impotence, Nocturia, Painful Urination, Urgency and Urine Leakage. Musculoskeletal Not Present- Back Pain, Joint Pain, Joint Stiffness, Muscle Pain, Muscle Weakness and Swelling of Extremities. Neurological Not Present- Decreased Memory, Fainting, Headaches, Numbness, Seizures, Tingling, Tremor, Trouble walking and Weakness. Psychiatric Not Present- Anxiety, Bipolar, Change in Sleep Pattern, Depression, Fearful and Frequent crying. Hematology Not Present- Blood Thinners, Easy Bruising, Excessive bleeding, Gland problems, HIV and Persistent Infections.  Vitals Andrew Mcmahon CMA; Mcmahon 9:39 AM) Mcmahon 9:38 AM Weight: 208.4 lb Height: 71in Body Surface Area: 2.15 m Body Mass Index: 29.07 kg/m  Temp.: 98.68F  Pulse: 64 (Regular)  BP: 136/80 (Sitting, Left Arm, Standard)       Physical Exam (Andrew Mcmahon A. Ninfa Linden MD; Mcmahon 10:04 AM) The physical exam findings are as follows: Note:On examination, the right inguinal hernia is larger but still reducible. There is no evidence of left inguinal hernia or umbilical. Generally well in appearance Lungs clear CV RRR Ext without edema Skin without rash    Assessment & Plan (Andrew Mcmahon A. Ninfa Linden MD; Mcmahon 10:04 AM) RIGHT INGUINAL HERNIA  (K40.90) Impression: I again discussed hernia repair with mesh with him. We again discussed both the open and  laparoscopic techniques. He wished to proceed with a laparoscopic right inguinal hernia repair with mesh. I discussed the risk of surgery with him in detail. He understands and wishes to proceed

## 2016-11-29 ENCOUNTER — Encounter (HOSPITAL_COMMUNITY)
Admission: RE | Disposition: A | Discharge status: Home / Self Care | Payer: Self-pay | Source: Ambulatory Visit | Attending: Surgery

## 2016-11-29 ENCOUNTER — Encounter (HOSPITAL_COMMUNITY): Payer: Self-pay | Admitting: *Deleted

## 2016-11-29 ENCOUNTER — Ambulatory Visit (HOSPITAL_COMMUNITY)
Admission: RE | Admit: 2016-11-29 | Discharge: 2016-11-29 | Disposition: A | Discharge status: Home / Self Care | Payer: Medicare Other | Source: Ambulatory Visit | Attending: Surgery | Admitting: Surgery

## 2016-11-29 ENCOUNTER — Ambulatory Visit (HOSPITAL_COMMUNITY): Payer: Medicare Other | Admitting: Anesthesiology

## 2016-11-29 DIAGNOSIS — I739 Peripheral vascular disease, unspecified: Secondary | ICD-10-CM | POA: Diagnosis not present

## 2016-11-29 DIAGNOSIS — M5410 Radiculopathy, site unspecified: Secondary | ICD-10-CM | POA: Diagnosis not present

## 2016-11-29 DIAGNOSIS — Z79899 Other long term (current) drug therapy: Secondary | ICD-10-CM | POA: Diagnosis not present

## 2016-11-29 DIAGNOSIS — Q249 Congenital malformation of heart, unspecified: Secondary | ICD-10-CM | POA: Diagnosis not present

## 2016-11-29 DIAGNOSIS — K4090 Unilateral inguinal hernia, without obstruction or gangrene, not specified as recurrent: Secondary | ICD-10-CM | POA: Insufficient documentation

## 2016-11-29 DIAGNOSIS — Z8042 Family history of malignant neoplasm of prostate: Secondary | ICD-10-CM | POA: Insufficient documentation

## 2016-11-29 DIAGNOSIS — Z808 Family history of malignant neoplasm of other organs or systems: Secondary | ICD-10-CM | POA: Insufficient documentation

## 2016-11-29 DIAGNOSIS — K219 Gastro-esophageal reflux disease without esophagitis: Secondary | ICD-10-CM | POA: Diagnosis not present

## 2016-11-29 DIAGNOSIS — Z7982 Long term (current) use of aspirin: Secondary | ICD-10-CM | POA: Insufficient documentation

## 2016-11-29 DIAGNOSIS — Z87442 Personal history of urinary calculi: Secondary | ICD-10-CM | POA: Diagnosis not present

## 2016-11-29 DIAGNOSIS — E785 Hyperlipidemia, unspecified: Secondary | ICD-10-CM | POA: Diagnosis not present

## 2016-11-29 DIAGNOSIS — M545 Low back pain: Secondary | ICD-10-CM | POA: Diagnosis not present

## 2016-11-29 HISTORY — PX: INGUINAL HERNIA REPAIR: SHX194

## 2016-11-29 HISTORY — PX: INSERTION OF MESH: SHX5868

## 2016-11-29 SURGERY — REPAIR, HERNIA, INGUINAL, LAPAROSCOPIC
Anesthesia: General | Site: Groin | Laterality: Right

## 2016-11-29 MED ORDER — ONDANSETRON HCL 4 MG/2ML IJ SOLN
INTRAMUSCULAR | Status: AC
  Filled 2016-11-29: qty 2

## 2016-11-29 MED ORDER — ACETAMINOPHEN 325 MG PO TABS
650.0000 mg | ORAL_TABLET | ORAL | Status: DC | PRN
Start: 2016-11-29 — End: 2016-11-29

## 2016-11-29 MED ORDER — MIDAZOLAM HCL 5 MG/5ML IJ SOLN
INTRAMUSCULAR | Status: DC | PRN
  Administered 2016-11-29: 2 mg via INTRAVENOUS

## 2016-11-29 MED ORDER — CEFAZOLIN SODIUM-DEXTROSE 2-4 GM/100ML-% IV SOLN
2.0000 g | INTRAVENOUS | Status: AC
  Administered 2016-11-29: 2 g via INTRAVENOUS
  Filled 2016-11-29: qty 100

## 2016-11-29 MED ORDER — OXYCODONE HCL 5 MG PO TABS
5.0000 mg | ORAL_TABLET | ORAL | Status: DC | PRN
  Administered 2016-11-29: 5 mg via ORAL

## 2016-11-29 MED ORDER — CHLORHEXIDINE GLUCONATE CLOTH 2 % EX PADS: 6.0000 | MEDICATED_PAD | Freq: Once | CUTANEOUS | Status: DC

## 2016-11-29 MED ORDER — ACETAMINOPHEN 650 MG RE SUPP: 650.0000 mg | RECTAL | Status: DC | PRN

## 2016-11-29 MED ORDER — PROPOFOL 10 MG/ML IV BOLUS
INTRAVENOUS | Status: DC | PRN
  Administered 2016-11-29: 150 mg via INTRAVENOUS

## 2016-11-29 MED ORDER — DEXAMETHASONE SODIUM PHOSPHATE 10 MG/ML IJ SOLN
INTRAMUSCULAR | Status: AC
  Filled 2016-11-29: qty 2

## 2016-11-29 MED ORDER — FENTANYL CITRATE (PF) 100 MCG/2ML IJ SOLN
INTRAMUSCULAR | Status: AC
  Filled 2016-11-29: qty 4

## 2016-11-29 MED ORDER — 0.9 % SODIUM CHLORIDE (POUR BTL) OPTIME
TOPICAL | Status: DC | PRN
  Administered 2016-11-29: 1000 mL

## 2016-11-29 MED ORDER — SODIUM CHLORIDE 0.9% FLUSH: 3.0000 mL | INTRAVENOUS | Status: DC | PRN

## 2016-11-29 MED ORDER — HYDROCODONE-ACETAMINOPHEN 5-325 MG PO TABS: 1.0000 | ORAL_TABLET | ORAL | 0 refills | Status: DC | PRN

## 2016-11-29 MED ORDER — MEPERIDINE HCL 25 MG/ML IJ SOLN: 6.2500 mg | INTRAMUSCULAR | Status: DC | PRN

## 2016-11-29 MED ORDER — OXYCODONE HCL 5 MG PO TABS
ORAL_TABLET | ORAL | Status: AC
  Administered 2016-11-29: 5 mg via ORAL
  Filled 2016-11-29: qty 1

## 2016-11-29 MED ORDER — BUPIVACAINE HCL (PF) 0.25 % IJ SOLN
INTRAMUSCULAR | Status: DC | PRN
Start: 2016-11-29 — End: 2016-11-29
  Administered 2016-11-29: 20 mL

## 2016-11-29 MED ORDER — LACTATED RINGERS IV SOLN
INTRAVENOUS | Status: DC | PRN
  Administered 2016-11-29: 09:00:00 via INTRAVENOUS

## 2016-11-29 MED ORDER — SCOPOLAMINE 1 MG/3DAYS TD PT72
MEDICATED_PATCH | TRANSDERMAL | Status: AC
  Filled 2016-11-29: qty 1

## 2016-11-29 MED ORDER — HYDROMORPHONE HCL 1 MG/ML IJ SOLN: 0.2500 mg | INTRAMUSCULAR | Status: DC | PRN

## 2016-11-29 MED ORDER — FENTANYL CITRATE (PF) 100 MCG/2ML IJ SOLN
INTRAMUSCULAR | Status: AC
  Filled 2016-11-29: qty 2

## 2016-11-29 MED ORDER — DEXAMETHASONE SODIUM PHOSPHATE 10 MG/ML IJ SOLN
INTRAMUSCULAR | Status: DC | PRN
  Administered 2016-11-29: 10 mg via INTRAVENOUS

## 2016-11-29 MED ORDER — SODIUM CHLORIDE 0.9% FLUSH: 3.0000 mL | Freq: Two times a day (BID) | INTRAVENOUS | Status: DC

## 2016-11-29 MED ORDER — LIDOCAINE HCL (CARDIAC) 20 MG/ML IV SOLN
INTRAVENOUS | Status: DC | PRN
  Administered 2016-11-29: 80 mg via INTRAVENOUS

## 2016-11-29 MED ORDER — PROMETHAZINE HCL 25 MG/ML IJ SOLN: 6.2500 mg | INTRAMUSCULAR | Status: DC | PRN

## 2016-11-29 MED ORDER — ROCURONIUM BROMIDE 100 MG/10ML IV SOLN
INTRAVENOUS | Status: DC | PRN
  Administered 2016-11-29: 40 mg via INTRAVENOUS
  Administered 2016-11-29: 50 mg via INTRAVENOUS

## 2016-11-29 MED ORDER — SODIUM CHLORIDE 0.9 % IV SOLN: 250.0000 mL | INTRAVENOUS | Status: DC | PRN

## 2016-11-29 MED ORDER — MIDAZOLAM HCL 2 MG/2ML IJ SOLN
INTRAMUSCULAR | Status: AC
Start: 2016-11-29 — End: 2016-11-29
  Filled 2016-11-29: qty 2

## 2016-11-29 MED ORDER — ONDANSETRON HCL 4 MG/2ML IJ SOLN
INTRAMUSCULAR | Status: DC | PRN
Start: 2016-11-29 — End: 2016-11-29
  Administered 2016-11-29: 4 mg via INTRAVENOUS

## 2016-11-29 MED ORDER — MORPHINE SULFATE (PF) 2 MG/ML IV SOLN: 1.0000 mg | INTRAVENOUS | Status: DC | PRN

## 2016-11-29 MED ORDER — PROPOFOL 10 MG/ML IV BOLUS
INTRAVENOUS | Status: AC
  Filled 2016-11-29: qty 20

## 2016-11-29 MED ORDER — HYDROCODONE-ACETAMINOPHEN 5-325 MG PO TABS: 1.0000 | ORAL_TABLET | Freq: Once | ORAL | Status: DC

## 2016-11-29 MED ORDER — BUPIVACAINE HCL (PF) 0.25 % IJ SOLN
INTRAMUSCULAR | Status: AC
  Filled 2016-11-29: qty 30

## 2016-11-29 MED ORDER — LACTATED RINGERS IV SOLN
Freq: Once | INTRAVENOUS | Status: AC
  Administered 2016-11-29: 08:00:00 via INTRAVENOUS

## 2016-11-29 MED ORDER — MIDAZOLAM HCL 2 MG/2ML IJ SOLN
INTRAMUSCULAR | Status: AC
  Filled 2016-11-29: qty 2

## 2016-11-29 MED ORDER — FENTANYL CITRATE (PF) 100 MCG/2ML IJ SOLN
INTRAMUSCULAR | Status: DC | PRN
  Administered 2016-11-29 (×2): 50 ug via INTRAVENOUS
  Administered 2016-11-29: 100 ug via INTRAVENOUS

## 2016-11-29 MED ORDER — SCOPOLAMINE 1 MG/3DAYS TD PT72
1.0000 | MEDICATED_PATCH | TRANSDERMAL | Status: DC
  Administered 2016-11-29: 1 via TRANSDERMAL

## 2016-11-29 MED ORDER — LIDOCAINE 2% (20 MG/ML) 5 ML SYRINGE
INTRAMUSCULAR | Status: AC
  Filled 2016-11-29: qty 5

## 2016-11-29 MED ORDER — SUGAMMADEX SODIUM 200 MG/2ML IV SOLN
INTRAVENOUS | Status: DC | PRN
  Administered 2016-11-29: 200 mg via INTRAVENOUS

## 2016-11-29 SURGICAL SUPPLY — 34 items
ADH SKN CLS APL DERMABOND .7 (GAUZE/BANDAGES/DRESSINGS) ×1
APPLIER CLIP LOGIC TI 5 (MISCELLANEOUS) IMPLANT
APR CLP MED LRG 33X5 (MISCELLANEOUS)
BLADE SURG ROTATE 9660 (MISCELLANEOUS) IMPLANT
CANISTER SUCTION 2500CC (MISCELLANEOUS) IMPLANT
CHLORAPREP W/TINT 26ML (MISCELLANEOUS) ×3 IMPLANT
COVER SURGICAL LIGHT HANDLE (MISCELLANEOUS) ×3 IMPLANT
DERMABOND ADVANCED (GAUZE/BANDAGES/DRESSINGS) ×2
DERMABOND ADVANCED .7 DNX12 (GAUZE/BANDAGES/DRESSINGS) ×1 IMPLANT
DEVICE SECURE STRAP 25 ABSORB (INSTRUMENTS) ×3 IMPLANT
DISSECT BALLN SPACEMKR + OVL (BALLOONS) ×3
DISSECTOR BALLN SPACEMKR + OVL (BALLOONS) ×1 IMPLANT
DISSECTOR BLUNT TIP ENDO 5MM (MISCELLANEOUS) IMPLANT
DRAPE LAPAROSCOPIC ABDOMINAL (DRAPES) ×3 IMPLANT
ELECT REM PT RETURN 9FT ADLT (ELECTROSURGICAL) ×3
ELECTRODE REM PT RTRN 9FT ADLT (ELECTROSURGICAL) ×1 IMPLANT
GLOVE SURG SIGNA 7.5 PF LTX (GLOVE) ×3 IMPLANT
GOWN STRL REUS W/ TWL LRG LVL3 (GOWN DISPOSABLE) ×2 IMPLANT
GOWN STRL REUS W/ TWL XL LVL3 (GOWN DISPOSABLE) ×1 IMPLANT
GOWN STRL REUS W/TWL LRG LVL3 (GOWN DISPOSABLE) ×6
GOWN STRL REUS W/TWL XL LVL3 (GOWN DISPOSABLE) ×3
KIT BASIN OR (CUSTOM PROCEDURE TRAY) ×3 IMPLANT
KIT ROOM TURNOVER OR (KITS) ×3 IMPLANT
MESH 3DMAX 4X6 RT LRG (Mesh General) ×3 IMPLANT
NS IRRIG 1000ML POUR BTL (IV SOLUTION) ×3 IMPLANT
PAD ARMBOARD 7.5X6 YLW CONV (MISCELLANEOUS) ×3 IMPLANT
SET IRRIG TUBING LAPAROSCOPIC (IRRIGATION / IRRIGATOR) IMPLANT
SET TROCAR LAP APPLE-HUNT 5MM (ENDOMECHANICALS) ×3 IMPLANT
SUT MNCRL AB 4-0 PS2 18 (SUTURE) ×3 IMPLANT
TOWEL OR 17X24 6PK STRL BLUE (TOWEL DISPOSABLE) ×3 IMPLANT
TOWEL OR 17X26 10 PK STRL BLUE (TOWEL DISPOSABLE) ×3 IMPLANT
TRAY FOLEY CATH 16FR SILVER (SET/KITS/TRAYS/PACK) ×3 IMPLANT
TRAY LAPAROSCOPIC MC (CUSTOM PROCEDURE TRAY) ×3 IMPLANT
TUBING INSUFFLATION (TUBING) ×3 IMPLANT

## 2016-11-29 NOTE — Transfer of Care (Signed)
Immediate Anesthesia Transfer of Care Note  Patient: Andrew Mcmahon  Procedure(s) Performed: Procedure(s): LAPAROSCOPIC RIGHT  INGUINAL HERNIA (Right) INSERTION OF MESH (Right)  Patient Location: PACU  Anesthesia Type:General  Level of Consciousness: awake, alert , oriented and patient cooperative  Airway & Oxygen Therapy: Patient Spontanous Breathing  Post-op Assessment: Report given to RN, Post -op Vital signs reviewed and stable and Patient moving all extremities  Post vital signs: Reviewed and stable  Last Vitals:  Vitals:   11/29/16 0712  BP: (!) 149/81  Pulse: 61  Resp: 20  Temp: 36.7 C    Last Pain:  Vitals:   11/29/16 0712  TempSrc: Oral      Patients Stated Pain Goal: 2 ([id number])  Complications: No apparent anesthesia complications

## 2016-11-29 NOTE — Anesthesia Procedure Notes (Signed)
Procedure Name: Intubation Date/Time: 11/29/2016 10:44 AM Performed by: Rebekah Chesterfield L Pre-anesthesia Checklist: Patient identified, Emergency Drugs available, Suction available and Patient being monitored Patient Re-evaluated:Patient Re-evaluated prior to inductionOxygen Delivery Method: Circle System Utilized Preoxygenation: Pre-oxygenation with 100% oxygen Intubation Type: IV induction Ventilation: Mask ventilation without difficulty and Oral airway inserted - appropriate to patient size Laryngoscope Size: Mac and 4 Grade View: Grade I Tube type: Oral Tube size: 8.0 mm Number of attempts: 1 Airway Equipment and Method: Stylet and Oral airway Placement Confirmation: ETT inserted through vocal cords under direct vision,  positive ETCO2 and breath sounds checked- equal and bilateral Secured at: 22 cm Tube secured with: Tape Dental Injury: Teeth and Oropharynx as per pre-operative assessment

## 2016-11-29 NOTE — Op Note (Signed)
LAPAROSCOPIC RIGHT  INGUINAL HERNIA, INSERTION OF MESH  Procedure Note  Andrew Mcmahon 11/29/2016   Pre-op Diagnosis: right inguinal hernia     Post-op Diagnosis: same  Procedure(s): LAPAROSCOPIC RIGHT  INGUINAL HERNIA INSERTION OF MESH (Bard large 3 D Max prolene mesh)  Surgeon(s): Coralie Keens, MD  Anesthesia: General  Staff:  Circulator: Rosanne Sack, RN Scrub Person: Christeen Lashell Moffitt, CST; Christen Bame, RN; Georgeanna Harrison Circulator Assistant: Harrel Lemon, RN; Maurene Capes, RN; Christen Bame, RN  Estimated Blood Loss: Minimal               Findings: The patient was found to have a moderate-sized indirect right inguinal hernia which was repaired with a piece of 3-D Max Prolene mesh from Bard  Procedure: The patient was brought to the operating room and identified as the correct patient. He was placed supine on the operating room table and general anesthesia was induced. His abdomen was then prepped and draped in the usual sterile fashion. I made a vertical incision below the likes with the scalpel. I took this down to the fascia which was then opened to the right of the midline. The rectus muscle was identified and elevated. I then passed the dissecting balloon underneath the rectus muscle and manipulated toward the pubis. The dissecting balloon was then insufflated under direct vision dissecting out the preperitoneal space. The dissecting balloon was then removed and insufflation was begun with carbon dioxide. 25 mm trochars in place the patient's lower midline. There is no evidence of left inguinal hernia. The patient had a large indirect right inguinal hernia. I was able to dissect the large hernia sac from the cord structures and reduced it completely. I then brought a piece of Bard 3-D max large Prolene mesh onto the field. I placed it through the umbilical trocar and open as an on lay on the right inguinal floor. I then tacked it to Cooper's ligament of the medial  abdominal wall and slightly laterally with the absorbable tacker. Wide coverage of the cord structures and inguinal area appeared to be achieved. I then made sure the sac was well away from the mesh. We then removed the midline trochars under direct vision and the preperitoneal space collapse and probably with a mesh in place. I then removed the umbilical trocar and closed the fascia with a figure-of-eight 0 Vicryl suture. I anesthetized all incisions with Marcaine and performed a right ilioinguinal nerve block with Marcaine. I then closed all incisions with 4-0 Monocryl sutures and Dermabond. The patient tolerated the procedure well. All the counts were correct at the end of the procedure. The patient was then extubated in the operating room and taken in a stable condition to the recovery room.          Andrew Mcmahon A   Date: 11/29/2016  Time: 11:21 AM

## 2016-11-29 NOTE — Interval H&P Note (Signed)
History and Physical Interval Note: no change in H and P  11/29/2016 8:09 AM  Andrew Mcmahon  has presented today for surgery, with the diagnosis of right inguinal hernia  The various methods of treatment have been discussed with the patient and family. After consideration of risks, benefits and other options for treatment, the patient has consented to  Procedure(s): LAPAROSCOPIC RIGHT  INGUINAL HERNIA (Right) INSERTION OF MESH (Right) as a surgical intervention .  The patient's history has been reviewed, patient examined, no change in status, stable for surgery.  I have reviewed the patient's chart and labs.  Questions were answered to the patient's satisfaction.     Andrew Mcmahon A

## 2016-11-29 NOTE — Anesthesia Preprocedure Evaluation (Signed)
Anesthesia Evaluation  Patient identified by MRN, date of birth, ID band Patient awake    Reviewed: Allergy & Precautions, NPO status , Patient's Chart, lab work & pertinent test results  History of Anesthesia Complications (+) PONV and history of anesthetic complications  Airway Mallampati: II       Dental no notable dental hx. (+) Teeth Intact   Pulmonary neg pulmonary ROS,    Pulmonary exam normal breath sounds clear to auscultation       Cardiovascular + Peripheral Vascular Disease  Normal cardiovascular exam Rhythm:Regular Rate:Normal     Neuro/Psych Anxiety Hx/o Panic attacksnegative neurological ROS     GI/Hepatic Neg liver ROS, GERD  Medicated and Controlled,  Endo/Other  negative endocrine ROS  Renal/GU Renal diseaseHx/o renal calculi  negative genitourinary   Musculoskeletal Right inguinal hernia Low back pain with radiculopathy   Abdominal   Peds  (+) Congenital Heart Disease Hematology negative hematology ROS (+)   Anesthesia Other Findings   Reproductive/Obstetrics                               Chemistry      Component Value Date/Time   NA 142 11/23/2016 1352   K 4.3 11/23/2016 1352   CL 109 11/23/2016 1352   CO2 27 11/23/2016 1352   BUN 13 11/23/2016 1352   CREATININE 1.11 11/23/2016 1352      Component Value Date/Time   CALCIUM 9.5 11/23/2016 1352   ALKPHOS 46 01/28/2014 0937   AST 21 01/28/2014 0937   ALT 25 01/28/2014 0937   BILITOT 0.7 01/28/2014 0937     Lab Results  Component Value Date   WBC 6.4 11/23/2016   HGB 16.0 11/23/2016   HCT 46.4 11/23/2016   MCV 91.5 11/23/2016   PLT 165 11/23/2016   EKG: normal EKG, normal sinus rhythm.  Anesthesia Physical Anesthesia Plan  ASA: II  Anesthesia Plan: General   Post-op Pain Management:    Induction: Intravenous  Airway Management Planned: LMA and Oral ETT  Additional Equipment:   Intra-op  Plan:   Post-operative Plan: Extubation in OR  Informed Consent: I have reviewed the patients History and Physical, chart, labs and discussed the procedure including the risks, benefits and alternatives for the proposed anesthesia with the patient or authorized representative who has indicated his/her understanding and acceptance.   Dental advisory given  Plan Discussed with: CRNA, Anesthesiologist and Surgeon  Anesthesia Plan Comments:         Anesthesia Quick Evaluation

## 2016-11-29 NOTE — Discharge Instructions (Signed)
[facility] _______[facility]  UMBILICAL OR INGUINAL HERNIA REPAIR: POST OP INSTRUCTIONS  Always review your discharge instruction sheet given to you by the facility where your surgery was performed. IF YOU HAVE DISABILITY OR FAMILY LEAVE FORMS, YOU MUST BRING THEM TO THE OFFICE FOR PROCESSING.   DO NOT GIVE THEM TO YOUR DOCTOR.  1. A  prescription for pain medication may be given to you upon discharge.  Take your pain medication as prescribed, if needed.  If narcotic pain medicine is not needed, then you may take acetaminophen (Tylenol) or ibuprofen (Advil) as needed. 2. Take your usually prescribed medications unless otherwise directed. If you need a refill on your pain medication, please contact your pharmacy.  They will contact our office to request authorization. Prescriptions will not be filled after 5 pm or on week-ends. 3. You should follow a light diet the first 24 hours after arrival home, such as soup and crackers, etc.  Be sure to include lots of fluids daily.  Resume your normal diet the day after surgery. 4.Most patients will experience some swelling and bruising around the umbilicus or in the groin and scrotum.  Ice packs and reclining will help.  Swelling and bruising can take several days to resolve.  6. It is common to experience some constipation if taking pain medication after surgery.  Increasing fluid intake and taking a stool softener (such as Colace) will usually help or prevent this problem from occurring.  A mild laxative (Milk of Magnesia or Miralax) should be taken according to package directions if there are no bowel movements after 48 hours. 7. Unless discharge instructions indicate otherwise, you may remove your bandages 24-48 hours after surgery, and you may shower at that time.  You may have steri-strips (small skin tapes) in place directly over the incision.  These strips should be left on the skin for 7-10 days.  If your surgeon used skin glue on the  incision, you may shower in 24 hours.  The glue will flake off over the next 2-3 weeks.  Any sutures or staples will be removed at the office during your follow-up visit. 8. ACTIVITIES:  You may resume regular (light) daily activities beginning the next day--such as daily self-care, walking, climbing stairs--gradually increasing activities as tolerated.  You may have sexual intercourse when it is comfortable.  Refrain from any heavy lifting or straining until approved by your doctor.  a.You may drive when you are no longer taking prescription pain medication, you can comfortably wear a seatbelt, and you can safely maneuver your car and apply brakes. b.RETURN TO WORK:   _____________________________________________  9.You should see your doctor in the office for a follow-up appointment approximately 2-3 weeks after your surgery.  Make sure that you call for this appointment within a day or two after you arrive home to insure a convenient appointment time. 10.OTHER INSTRUCTIONS: __OK TO SHOWER TOMORROW ICE PACK AND IBUPROFEN ALSO FOR PAIN NO LIFTING MORE THAN 15 POUNDS FOR 3 WEEKS_______________________    _____________________________________  WHEN TO CALL YOUR DOCTOR: 1. Fever over 101.0 2. Inability to urinate 3. Nausea and/or vomiting 4. Extreme swelling or bruising 5. Continued bleeding from incision. 6. Increased pain, redness, or drainage from the incision  The clinic staff is available to answer your questions during regular business hours.  Please dont hesitate to call and ask to speak to one of the nurses for clinical concerns.  If you have a medical emergency, go to the nearest emergency room  or call 911.  A surgeon from [facility] is always on call at the hospital   [address], [place], [place], [place]  [address] ?  [address], [place], [place]   [address] [phone] ? [phone] ? FAX [phone] Web site: [email or web address]

## 2016-11-29 NOTE — Anesthesia Postprocedure Evaluation (Signed)
Anesthesia Post Note  Patient: Andrew Mcmahon  Procedure(s) Performed: Procedure(s) (LRB): LAPAROSCOPIC RIGHT  INGUINAL HERNIA (Right) INSERTION OF MESH (Right)  Patient location during evaluation: PACU Anesthesia Type: General Level of consciousness: awake and alert Pain management: pain level controlled Vital Signs Assessment: post-procedure vital signs reviewed and stable Respiratory status: spontaneous breathing, nonlabored ventilation and respiratory function stable Cardiovascular status: blood pressure returned to baseline and stable Postop Assessment: no signs of nausea or vomiting Anesthetic complications: no       Last Vitals:  Vitals:   11/29/16 1145 11/29/16 1200  BP: 138/86 (!) 155/93  Pulse: 64 63  Resp: 13 18  Temp:      Last Pain:  Vitals:   11/29/16 0712  TempSrc: Oral                 Simrin Vegh A.

## 2016-11-30 ENCOUNTER — Encounter (HOSPITAL_COMMUNITY): Payer: Self-pay | Admitting: Surgery

## 2016-12-20 NOTE — Progress Notes (Addendum)
HPI:  Follow up several skin lesions, presumed actinic keratosis. Treated with LN2 about 1 month ago. L cheek and L ear. He feels they have improved, but still has a little scaly skin there. He also has a new concern -  feels that his ear may be clogged. Feel full. No pain, drainage, fever or hearing loss. Has had his ears cleaned out in the past.  ROS: See pertinent positives and negatives per HPI.  Past Medical History:  Diagnosis Date  . Back pain    low, hx radiculopathy, saw ortho in the past  . Closed right ankle fracture 09/2015   per pt due to fall while hiking  . Deviated nasal septum   . GERD (gastroesophageal reflux disease)   . History of kidney stones    kidney stones - h/o   . Kidney stones    history  . 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 Panic attack   . PONV (postoperative nausea and vomiting)   . Right inguinal hernia   . Wears glasses     Past Surgical History:  Procedure Laterality Date  . COLONOSCOPY    . INGUINAL HERNIA REPAIR Right 11/29/2016   Procedure: LAPAROSCOPIC RIGHT  INGUINAL HERNIA;  Surgeon: Coralie Keens, MD;  Location: [place];  Service: General;  Laterality: Right;  . INSERTION OF MESH Right 11/29/2016   Procedure: INSERTION OF MESH;  Surgeon: Coralie Keens, MD;  Location: [place];  Service: General;  Laterality: Right;  . MASS EXCISION  08/08/2012   Procedure: MINOR EXCISION OF MASS;  Surgeon: Cammie Sickle., MD;  Location: [place];  Service: Orthopedics;  Laterality: Right;  Minor Excision of Foreign Body  . MOHS SURGERY    . NASAL SEPTUM SURGERY    . WISDOM TOOTH EXTRACTION  1980    Family History  Problem Relation Age of Onset  . Liver disease Other   . Multiple myeloma Father   . Melanoma Father     Social History   Social History  . Marital status: Married    Spouse name: N/A  . Number of children: 0  . Years of education: N/A   Occupational History  . consultant    Social  History Main Topics  . Smoking status: Never Smoker  . Smokeless tobacco: Never Used  . Alcohol use Yes     Comment: daily- wine- 2 glasses q day    . Drug use: No  . Sexual activity: Not Asked   Other Topics Concern  . None   Social History Narrative  . None     Current Outpatient Prescriptions:  .  ALPRAZolam (XANAX) 0.5 MG tablet, One half to one tablet when necessary (Patient taking differently: Take 0.25 mg by mouth daily as needed for anxiety. ), Disp: 30 tablet, Rfl: 1 .  aspirin EC 81 MG tablet, Take 81 mg by mouth daily., Disp: , Rfl:  .  esomeprazole (NEXIUM) 20 MG capsule, Take 20 mg by mouth every other day., Disp: , Rfl:  .  fish oil-omega-3 fatty acids 1000 MG capsule, Take 1 g by mouth daily. , Disp: , Rfl:  .  Probiotic Product (PROBIOTIC PO), Take 1 capsule by mouth daily. , Disp: , Rfl:   EXAM:  Vitals:   12/21/16 1042  BP: 120/80  Pulse: (!) 57  Temp: 97.9 F (36.6 C)    Body mass index is 29.01 kg/m.  GENERAL: vitals reviewed and listed  above, alert, oriented, appears well hydrated and in no acute distress  HEENT: atraumatic, conjunttiva clear, no obvious abnormalities on inspection of external nose and ears; cerumen impaction bilat; normal inspection R and L ear canal and TMs after removal wax  NECK: no obvious masses on inspection  SKIN: very small area thin scale L cheek, 1m area scaly skin L ear helix  MS: moves all extremities without noticeable abnormality  PSYCH: pleasant and cooperative, no obvious depression or anxiety  ASSESSMENT AND PLAN:  Discussed the following assessment and plan:  Actinic keratosis -discussed other potential etiologies, tx options, risks -he wishes to try LN2 again with close follow up -tolerated well -advised if lesions do not completely resolve after 2-3 treatments that he will need to see his dermatologist and he agrees to do so  Bilateral impacted cerumen -was able to move large soft lump wax from R  ear canal with soft curette, he tolerated well -wax in left canal was hard and he opted for lavage, tolerated well, but then did require soft curette removal remaining wax left ear as well, normal canal and TM bilat after both  -Patient advised to return or notify a doctor immediately if symptoms worsen or persist or new concerns arise.  Patient Instructions  BEFORE YOU LEAVE: -ear lavage Left -follow up: 1 month    KColin BentonR., DO

## 2016-12-21 ENCOUNTER — Encounter: Payer: Self-pay | Admitting: Family Medicine

## 2016-12-21 ENCOUNTER — Ambulatory Visit (INDEPENDENT_AMBULATORY_CARE_PROVIDER_SITE_OTHER): Payer: Medicare Other | Admitting: Family Medicine

## 2016-12-21 VITALS — BP 120/80 | HR 57 | Temp 97.9°F | Ht 71.0 in | Wt 208.0 lb

## 2016-12-21 DIAGNOSIS — L570 Actinic keratosis: Secondary | ICD-10-CM

## 2016-12-21 DIAGNOSIS — H6123 Impacted cerumen, bilateral: Secondary | ICD-10-CM

## 2016-12-21 NOTE — Progress Notes (Signed)
Pre visit review using our clinic review tool, if applicable. No additional management support is needed unless otherwise documented below in the visit note. 

## 2016-12-21 NOTE — Patient Instructions (Signed)
BEFORE YOU LEAVE: -ear lavage Left -follow up: 1 month

## 2017-01-14 NOTE — Progress Notes (Addendum)
HPI:  Follow up skin lesions - L cheek and L ear. Treated x2 with LN2.  Has appt in 3 weeks with dermatologist but wants to re spray today if still present. He also has another concern. Had inginal hernia surgery 6 weeks ago. Reports was playing golf 2 weeks later with surgeon's approval. Back to all regular activities. occ very mild sensation in area of surgery - not really a pain - but can still feel something there at times.  Due for Medicare exam in August. ROS: See pertinent positives and negatives per HPI.  Past Medical History:  Diagnosis Date  . Back pain    low, hx radiculopathy, saw ortho in the past  . Closed right ankle fracture 09/2015   per pt due to fall while hiking  . Deviated nasal septum   . GERD (gastroesophageal reflux disease)   . History of kidney stones    kidney stones - h/o   . Kidney stones    history  . 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 Panic attack   . PONV (postoperative nausea and vomiting)   . Right inguinal hernia   . Wears glasses     Past Surgical History:  Procedure Laterality Date  . COLONOSCOPY    . INGUINAL HERNIA REPAIR Right 11/29/2016   Procedure: LAPAROSCOPIC RIGHT  INGUINAL HERNIA;  Surgeon: Coralie Keens, MD;  Location: [place];  Service: General;  Laterality: Right;  . INSERTION OF MESH Right 11/29/2016   Procedure: INSERTION OF MESH;  Surgeon: Coralie Keens, MD;  Location: [place];  Service: General;  Laterality: Right;  . MASS EXCISION  08/08/2012   Procedure: MINOR EXCISION OF MASS;  Surgeon: Cammie Sickle., MD;  Location: [place];  Service: Orthopedics;  Laterality: Right;  Minor Excision of Foreign Body  . MOHS SURGERY    . NASAL SEPTUM SURGERY    . WISDOM TOOTH EXTRACTION  1980    Family History  Problem Relation Age of Onset  . Liver disease Other   . Multiple myeloma Father   . Melanoma Father     Social History   Social History  . Marital status: Married    Spouse name:  N/A  . Number of children: 0  . Years of education: N/A   Occupational History  . consultant    Social History Main Topics  . Smoking status: Never Smoker  . Smokeless tobacco: Never Used  . Alcohol use Yes     Comment: daily- wine- 2 glasses q day    . Drug use: No  . Sexual activity: Not Asked   Other Topics Concern  . None   Social History Narrative  . None     Current Outpatient Prescriptions:  .  aspirin EC 81 MG tablet, Take 81 mg by mouth daily., Disp: , Rfl:  .  esomeprazole (NEXIUM) 20 MG capsule, Take 20 mg by mouth every other day., Disp: , Rfl:  .  fish oil-omega-3 fatty acids 1000 MG capsule, Take 1 g by mouth daily. , Disp: , Rfl:  .  Probiotic Product (PROBIOTIC PO), Take 1 capsule by mouth daily. , Disp: , Rfl:   EXAM:  Vitals:   01/15/17 0931  BP: 122/80  Pulse: 66  Temp: 98.5 F (36.9 C)    Body mass index is 28.95 kg/m.  GENERAL: vitals reviewed and listed above, alert, oriented, appears well hydrated and in no acute distress  HEENT: atraumatic,  conjunttiva clear, no obvious abnormalities on inspection of external nose and ears  NECK: no obvious masses on inspection  SKIN: I do not anylonger appreciate any lesion on the L cheek, fine superficial hyperkeratotic scaly area L ear remains - improved from prior  MS: moves all extremities without noticeable abnormality  PSYCH: pleasant and cooperative, no obvious depression or anxiety  ASSESSMENT AND PLAN:  Discussed the following assessment and plan:  Actinic keratoses vs other -He opted for procedure cryo/LN2. Freeze thaw x3. Tolerated well. with follow up with derm in 3 weeks as planned to ensure resolved or for more definitive tx or biopsy  Right inguinal hernia, s/p repair -symptoms seem likely normal and likely related to healing/car tissue, but advised he notify surgeon if worsening or if they persist, he agreed  -Patient advised to return or notify a doctor immediately if symptoms  worsen or persist or new concerns arise.  Patient Instructions  BEFORE YOU LEAVE: -follow up: Medicare annual exam with Dr. Maudie Mercury in August.  See the dermatologist as planned - please ask that the recheck lesion on the rim of you Left ear.  See your surgeon if the discomfort worsens or persists.      Colin Benton R., DO

## 2017-01-15 ENCOUNTER — Encounter: Payer: Self-pay | Admitting: Family Medicine

## 2017-01-15 ENCOUNTER — Ambulatory Visit (INDEPENDENT_AMBULATORY_CARE_PROVIDER_SITE_OTHER): Payer: Medicare Other | Admitting: Family Medicine

## 2017-01-15 VITALS — BP 122/80 | HR 66 | Temp 98.5°F | Ht 71.0 in | Wt 207.6 lb

## 2017-01-15 DIAGNOSIS — K4090 Unilateral inguinal hernia, without obstruction or gangrene, not specified as recurrent: Secondary | ICD-10-CM

## 2017-01-15 DIAGNOSIS — L570 Actinic keratosis: Secondary | ICD-10-CM

## 2017-01-15 NOTE — Patient Instructions (Signed)
BEFORE YOU LEAVE: -follow up: Medicare annual exam with Dr. Maudie Mercury in August.  See the dermatologist as planned - please ask that the recheck lesion on the rim of you Left ear.  See your surgeon if the discomfort worsens or persists.

## 2017-01-15 NOTE — Progress Notes (Signed)
Pre visit review using our clinic review tool, if applicable. No additional management support is needed unless otherwise documented below in the visit note. 

## 2017-01-17 IMAGING — DX DG ANKLE COMPLETE 3+V*R*
3 series · 3 of 3 positions shown · non-contrast
Comparison: None.

ADDENDUM:
Please note a small bony fragment inferior to the medial malleolus
appears chronic. An acute avulsion fracture is less likely but not
excluded. Clinical correlation is recommended. There is soft tissue
swelling over the medial malleolus.
CLINICAL DATA: 64-year-old male with fall and right ankle pain.

EXAM:
RIGHT ANKLE - COMPLETE 3+ VIEW

[ankle ap]
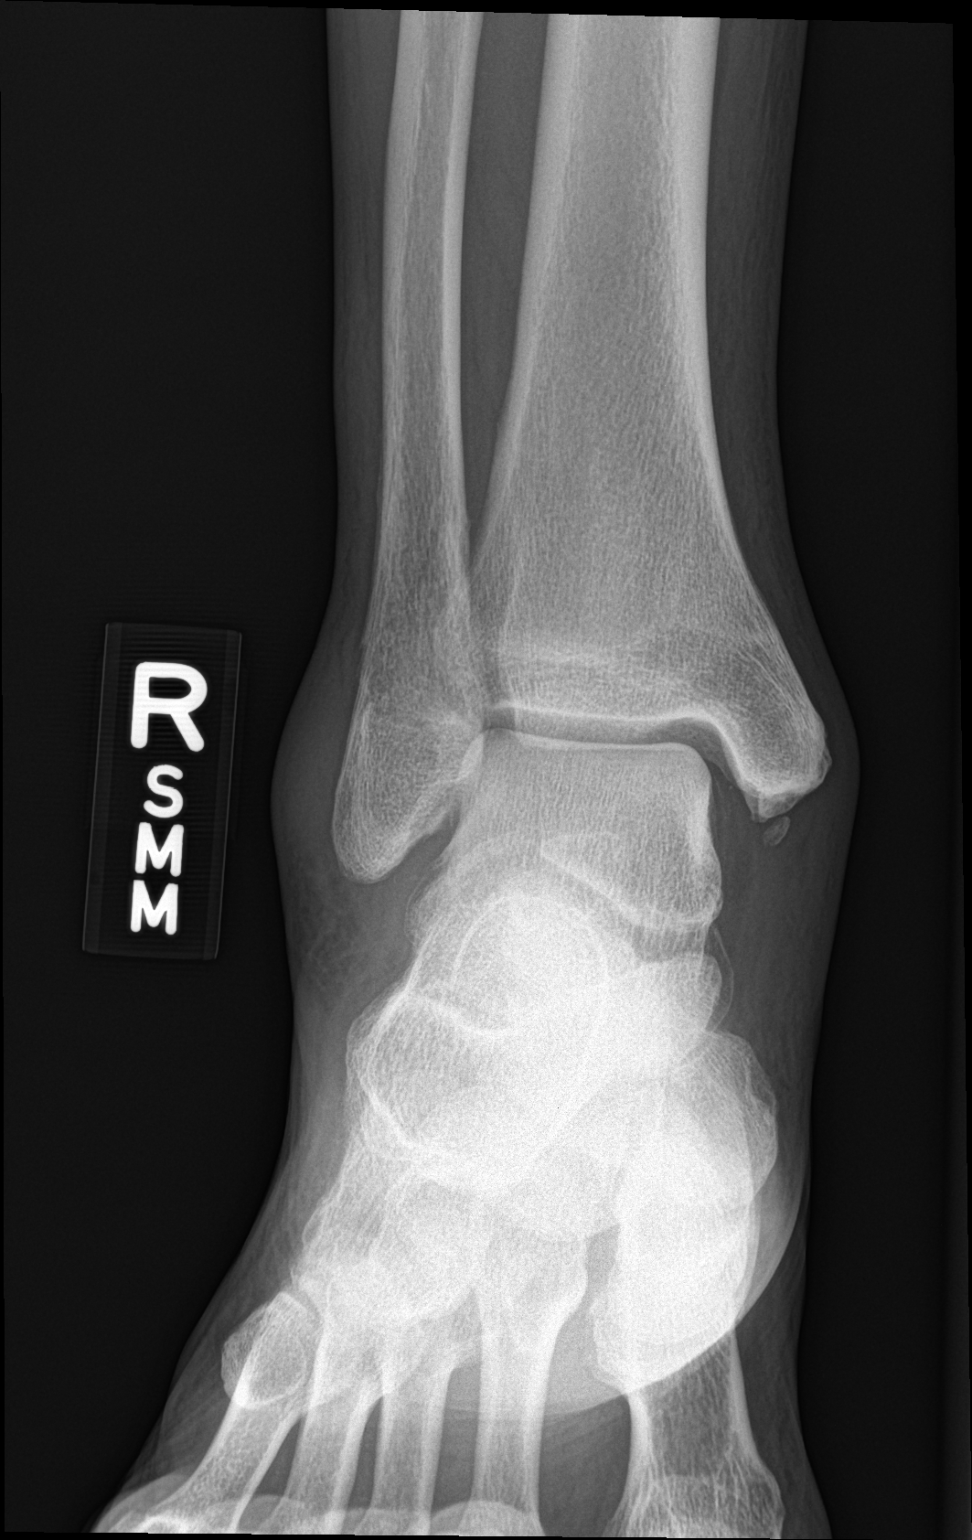

[ankle obl]
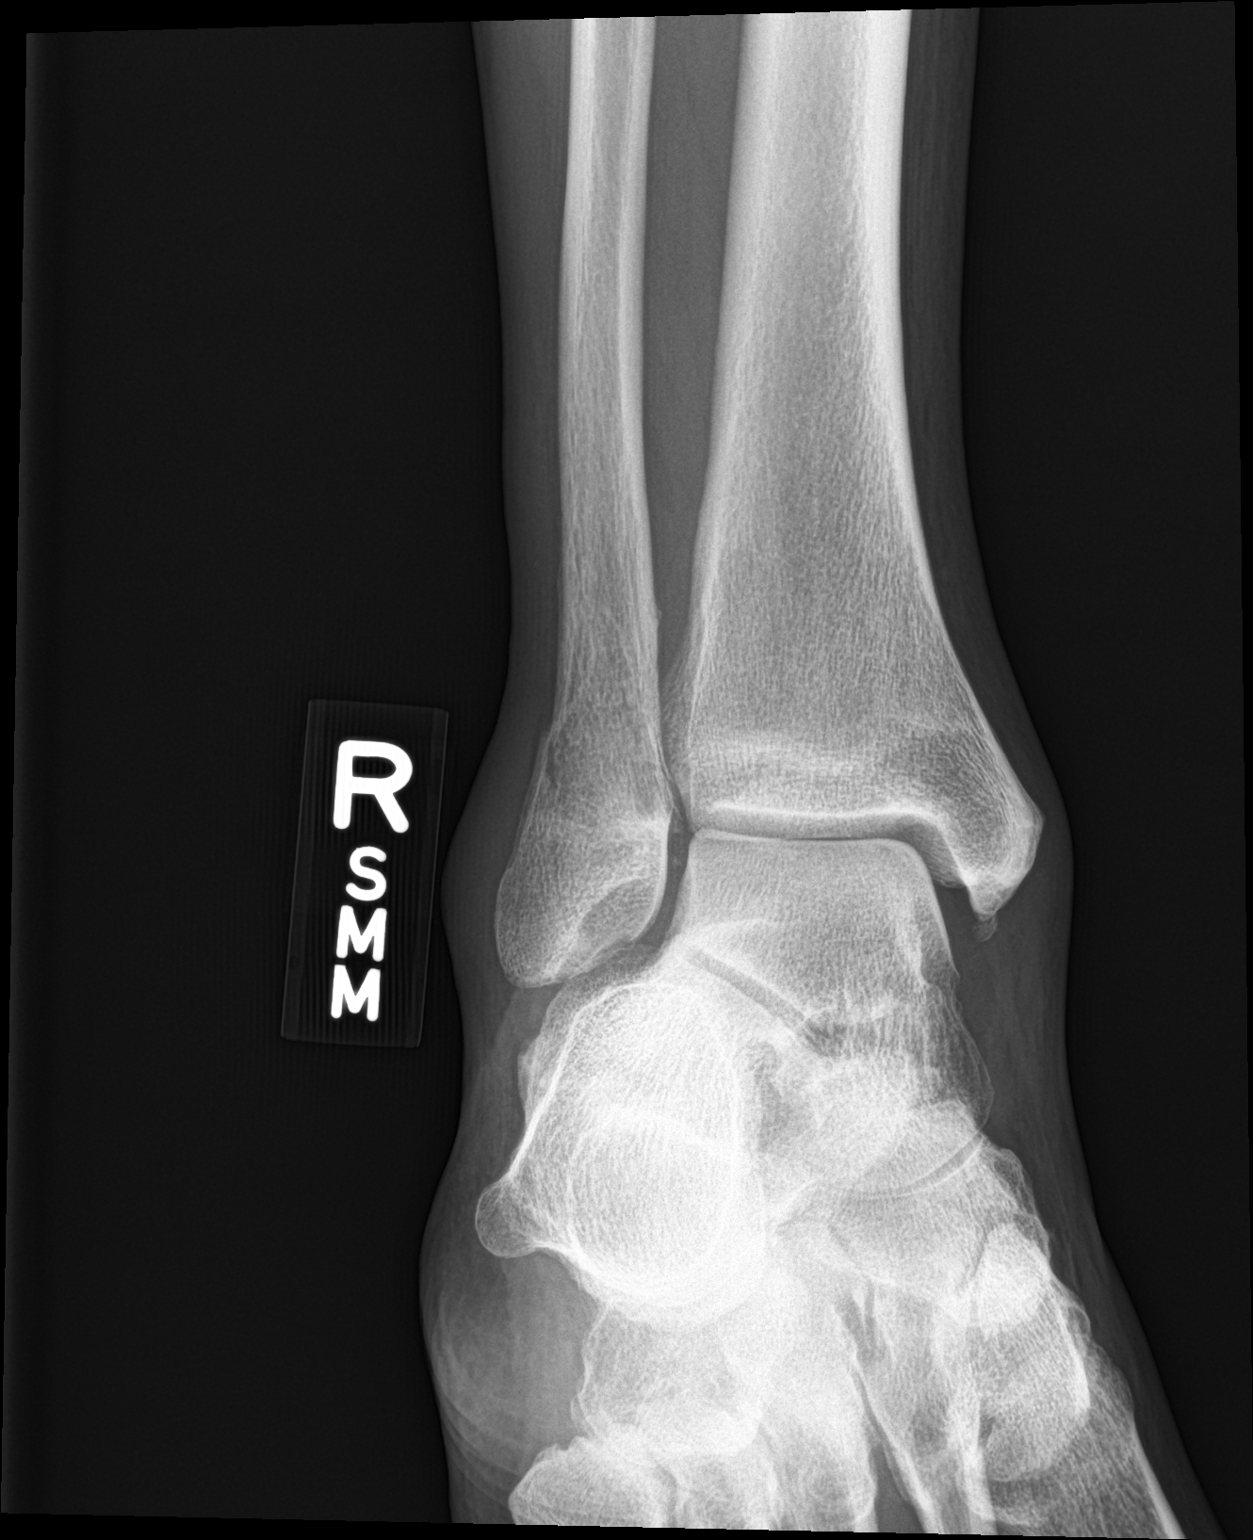

[ankle lat]
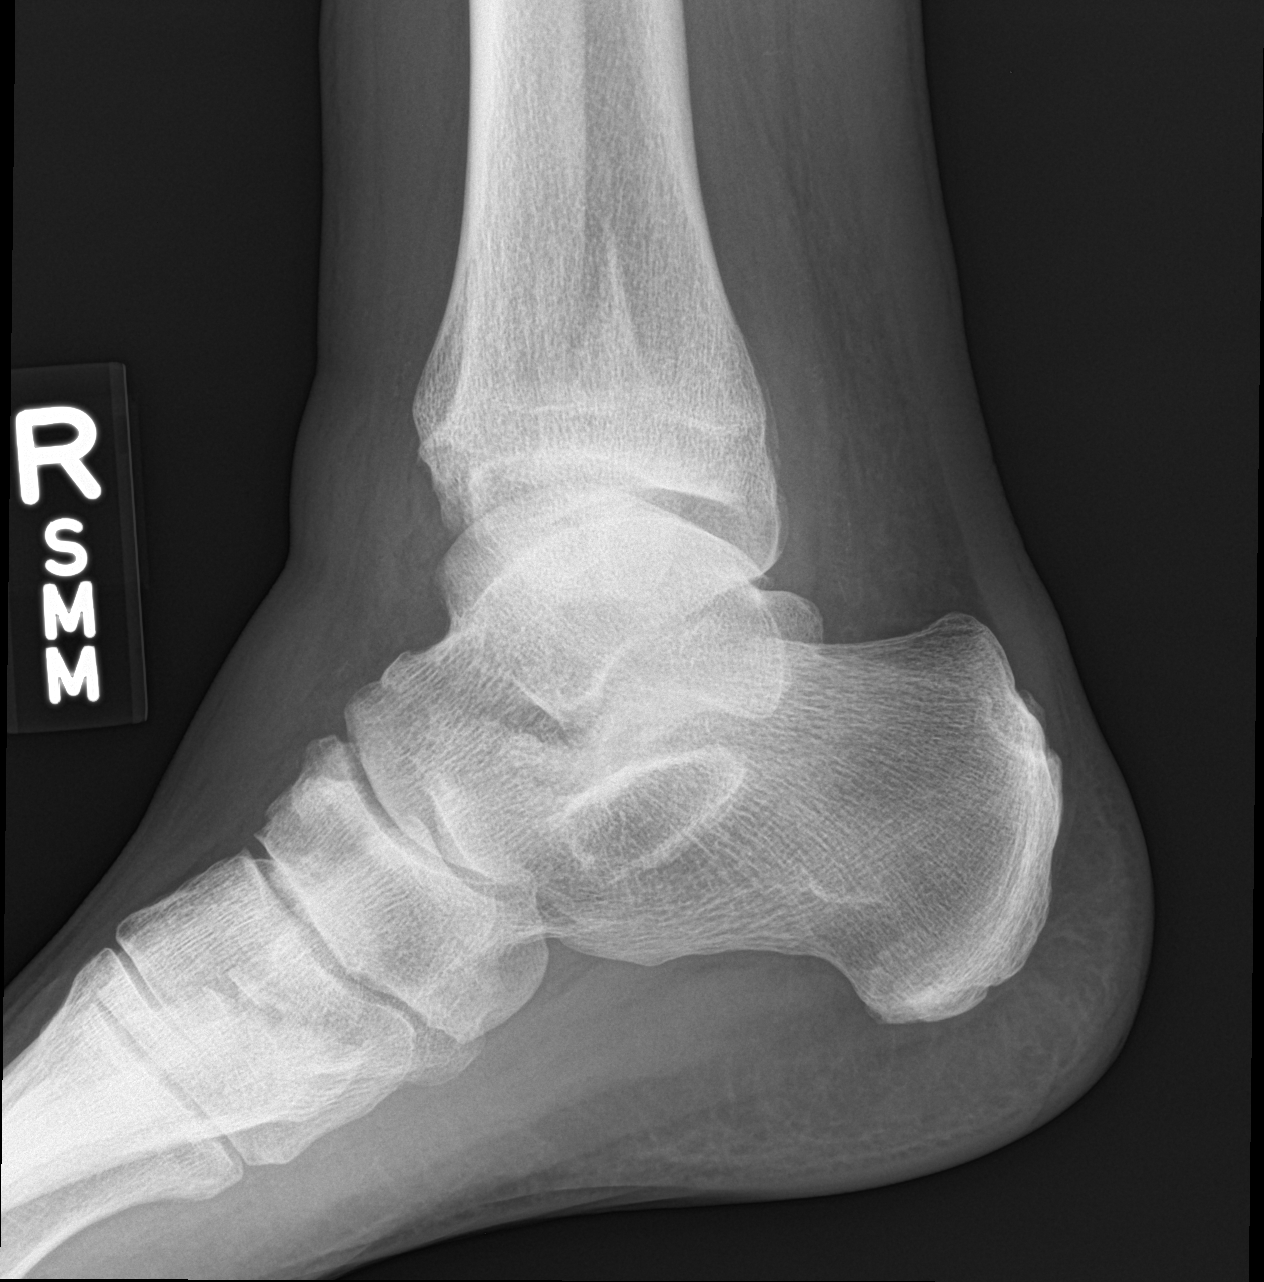

[3 of 3 positions shown; findings below may reference images not displayed]

FINDINGS: There is a nondisplaced oblique fracture of distal fibula. No other
fracture identified. The ankle mortise is intact. There is soft
swelling over the lateral malleolus.
IMPRESSION: Nondisplaced oblique fracture of the distal fibula.

## 2017-02-09 DIAGNOSIS — L723 Sebaceous cyst: Secondary | ICD-10-CM | POA: Diagnosis not present

## 2017-02-09 DIAGNOSIS — L570 Actinic keratosis: Secondary | ICD-10-CM | POA: Diagnosis not present

## 2017-02-09 DIAGNOSIS — Z85828 Personal history of other malignant neoplasm of skin: Secondary | ICD-10-CM | POA: Diagnosis not present

## 2017-06-18 DIAGNOSIS — D3131 Benign neoplasm of right choroid: Secondary | ICD-10-CM | POA: Diagnosis not present

## 2017-06-18 DIAGNOSIS — H5203 Hypermetropia, bilateral: Secondary | ICD-10-CM | POA: Diagnosis not present

## 2017-06-18 DIAGNOSIS — H2513 Age-related nuclear cataract, bilateral: Secondary | ICD-10-CM | POA: Diagnosis not present

## 2017-07-31 DIAGNOSIS — L821 Other seborrheic keratosis: Secondary | ICD-10-CM | POA: Diagnosis not present

## 2017-07-31 DIAGNOSIS — D1801 Hemangioma of skin and subcutaneous tissue: Secondary | ICD-10-CM | POA: Diagnosis not present

## 2017-07-31 DIAGNOSIS — L570 Actinic keratosis: Secondary | ICD-10-CM | POA: Diagnosis not present

## 2017-07-31 DIAGNOSIS — M7130 Other bursal cyst, unspecified site: Secondary | ICD-10-CM | POA: Diagnosis not present

## 2017-07-31 DIAGNOSIS — Z85828 Personal history of other malignant neoplasm of skin: Secondary | ICD-10-CM | POA: Diagnosis not present

## 2017-07-31 DIAGNOSIS — L812 Freckles: Secondary | ICD-10-CM | POA: Diagnosis not present

## 2017-11-30 ENCOUNTER — Other Ambulatory Visit: Payer: Self-pay | Admitting: Surgery

## 2017-11-30 DIAGNOSIS — R1031 Right lower quadrant pain: Secondary | ICD-10-CM | POA: Diagnosis not present

## 2018-01-24 ENCOUNTER — Ambulatory Visit (INDEPENDENT_AMBULATORY_CARE_PROVIDER_SITE_OTHER): Payer: Medicare Other | Admitting: Family Medicine

## 2018-01-24 ENCOUNTER — Encounter: Payer: Self-pay | Admitting: Family Medicine

## 2018-01-24 ENCOUNTER — Ambulatory Visit: Payer: Self-pay

## 2018-01-24 VITALS — BP 118/60 | HR 68 | Temp 97.6°F | Wt 205.0 lb

## 2018-01-24 DIAGNOSIS — R202 Paresthesia of skin: Secondary | ICD-10-CM | POA: Diagnosis not present

## 2018-01-24 NOTE — Telephone Encounter (Signed)
Phone call from pt.  Reported a numbness and tingling feeling in the left anterio-lateral lower leg @ approx. level of calf.  Clarified that the numbness is in the anterior and lateral left leg, but not in the calf.  Stated it has been present, constantly, for about 10 days.  Reported he will also feel the tingling down to the ankle area, at times.  Denied any mobility issues.  Stated he just walked about 6.5 miles.  Questioned about any issues with numbness or weakness of upper extremity?  Stated he has intermittent numbness in the left small finger, but it has been present for a very long time.  Denied headache, dizziness, speech difficulty, weakness of extremities, or balance issues. Per protocol, sched. Appt. with PCP office at 2:30 PM today.  Pt. agreed with plan.     Reason for Disposition . [1] Tingling (e.g., pins and needles) of the face, arm / hand, or leg / foot on one side of the body AND [2] present now  Answer Assessment - Initial Assessment Questions 1. SYMPTOM: "What is the main symptom you are concerned about?" (e.g., weakness, numbness)     Left lower leg numbness, about calf level in the left outer aspect of anterior leg, with intermittent numbness at level of ankle.  2. ONSET: "When did this start?" (minutes, hours, days; while sleeping)     10 days ago  3. LAST NORMAL: "When was the last time you were normal (no symptoms)?"     10 days ago  4. PATTERN "Does this come and go, or has it been constant since it started?"  "Is it present now?"    Constant  5. CARDIAC SYMPTOMS: "Have you had any of the following symptoms: chest pain, difficulty breathing, palpitations?"     Denies any cardiac symptoms or hx.  6. NEUROLOGIC SYMPTOMS: "Have you had any of the following symptoms: headache, dizziness, vision loss, double vision, changes in speech, unsteady on your feet?"    Intermittent tingling left little finger for years.; denied HA, dizziness, vision loss, unsteadiness.   7. OTHER  SYMPTOMS: "Do you have any other symptoms?"     Denied other symptoms 8. PREGNANCY: "Is there any chance you are pregnant?" "When was your last menstrual period?"     N/a  Protocols used: NEUROLOGIC DEFICIT-A-AH

## 2018-01-24 NOTE — Telephone Encounter (Signed)
Noted  

## 2018-01-25 ENCOUNTER — Encounter: Payer: Self-pay | Admitting: Family Medicine

## 2018-01-25 NOTE — Progress Notes (Signed)
Subjective:    Patient ID: Andrew Mcmahon, male    DOB: 11-13-1950, 67 y.o.   MRN: [id number]  Chief Complaint  Patient presents with  . Numbness    HPI Patient was seen today for ongoing concern.  Pt endorses numbness of a dime-sized area of his L shin x 10 days.  Pt was advised by the [facility] that he needed to be seen immediately.  Pt denies pain, injury to area, and cannot recall an inciting event.  Pt has not taken anything for the feeling and has not been limited in his daily activities from the feeling--has been able to play golf, drive, etc.  Past Medical History:  Diagnosis Date  . Back pain    low, hx radiculopathy, saw ortho in the past  . Closed right ankle fracture 09/2015   per pt due to fall while hiking  . Deviated nasal septum   . GERD (gastroesophageal reflux disease)   . History of kidney stones    kidney stones - h/o   . Kidney stones    history  . 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 Panic attack   . PONV (postoperative nausea and vomiting)   . Right inguinal hernia   . Wears glasses     Allergies  Allergen Reactions  . No Known Allergies     ROS General: Denies fever, chills, night sweats, changes in weight, changes in appetite HEENT: Denies headaches, ear pain, changes in vision, rhinorrhea, sore throat CV: Denies CP, palpitations, SOB, orthopnea Pulm: Denies SOB, cough, wheezing GI: Denies abdominal pain, nausea, vomiting, diarrhea, constipation GU: Denies dysuria, hematuria, frequency, vaginal discharge Msk: Denies muscle cramps, joint pains Neuro: Denies weakness, numbness, tingling  +numbness of L shin Skin: Denies rashes, bruising Psych: Denies depression, anxiety, hallucinations     Objective:    Blood pressure 118/60, pulse 68, temperature 97.6 F (36.4 C), temperature source Oral, weight 205 lb (93 kg), SpO2 97 %.   Gen. Pleasant, well-nourished, in no distress, normal affect   HEENT: [place]/AT, face symmetric, no scleral  icterus, nares patent without drainage Lungs: no accessory muscle use, CTAB, no wheezes or rales Cardiovascular: RRR, no m/r/g, no peripheral edema Musculoskeletal: No deformities, no cyanosis or clubbing, normal tone Neuro:  A&Ox3, CN II-XII intact, normal gait.  Slightly decreased sensation/discrimination to pin-prick and light touch of 2x 4 cm area of L lateral shin superior to ankle. Skin:  Warm, no lesions/ rash.  Normal appearing skin of LLE.   Wt Readings from Last 3 Encounters:  01/24/18 205 lb (93 kg)  01/15/17 207 lb 9.6 oz (94.2 kg)  12/21/16 208 lb (94.3 kg)    Lab Results  Component Value Date   WBC 6.4 11/23/2016   HGB 16.0 11/23/2016   HCT 46.4 11/23/2016   PLT 165 11/23/2016   GLUCOSE 98 11/23/2016   CHOL 214 (H) 06/12/2016   TRIG 128.0 06/12/2016   HDL 43.20 06/12/2016   LDLDIRECT 164.0 06/11/2015   LDLCALC 146 (H) 06/12/2016   ALT 25 01/28/2014   AST 21 01/28/2014   NA 142 11/23/2016   K 4.3 11/23/2016   CL 109 11/23/2016   CREATININE 1.11 11/23/2016   BUN 13 11/23/2016   CO2 27 11/23/2016   TSH 1.57 01/28/2014   PSA 2.96 06/12/2016   HGBA1C 5.3 06/12/2016    Assessment/Plan:  Paresthesia of left leg  -discussed possible causes including compression of superficial nerve, DM, vitamin deficiency. -Will monitor  for now. -consider labs hgb A1c, b12, folate -pt has an upcoming CPE with his pcp  Grier Mitts, MD

## 2018-01-28 ENCOUNTER — Ambulatory Visit: Payer: Medicare Other | Admitting: Family Medicine

## 2018-01-29 ENCOUNTER — Ambulatory Visit (INDEPENDENT_AMBULATORY_CARE_PROVIDER_SITE_OTHER): Payer: Medicare Other

## 2018-01-29 ENCOUNTER — Telehealth: Payer: Self-pay

## 2018-01-29 DIAGNOSIS — Z0000 Encounter for general adult medical examination without abnormal findings: Secondary | ICD-10-CM

## 2018-01-29 DIAGNOSIS — Z23 Encounter for immunization: Secondary | ICD-10-CM | POA: Diagnosis not present

## 2018-01-29 NOTE — Patient Instructions (Addendum)
Andrew Mcmahon , Thank you for taking time to come for your Medicare Wellness Visit. I appreciate your ongoing commitment to your health goals. Please review the following plan we discussed and let me know if I can assist you in the future.   Please schedule a medical fup with Dr. Maudie Mercury for annual labs as well as fup on numbness in chin left leg (prior seen by Dr. Volanda Napoleon)   Shingrix is a vaccine for the prevention of Shingles in Adults 38 and older.  If you are on Medicare, you can request a prescription from your doctor to be filled at a pharmacy.  Please check with your benefits regarding applicable copays or out of pocket expenses.  The Shingrix is given in 2 vaccines approx 8 weeks apart. You must receive the 2nd dose prior to 6 months from receipt of the first.   Check on Advanced Directive;  If you have one, you can bring dr. Maudie Mercury a copy     These are the goals we discussed: Goals    . Patient Stated     Continue to set goals that will improved your strength, mentally and physically        This is a list of the screening recommended for you and due dates:  Health Maintenance  Topic Date Due  . Pneumonia vaccines (2 of 2 - PPSV23) 06/12/2017  . Flu Shot  05/30/2018  . Cologuard (Stool DNA test)  10/13/2019  . Tetanus Vaccine  02/06/2024  .  Hepatitis C: One time screening is recommended by Center for Disease Control  (CDC) for  adults born from [birth year] through 1965.   Completed      Fall Prevention in the Home Falls can cause injuries. They can happen to people of all ages. There are many things you can do to make your home safe and to help prevent falls. What can I do on the outside of my home?  Regularly fix the edges of walkways and driveways and fix any cracks.  Remove anything that might make you trip as you walk through a door, such as a raised step or threshold.  Trim any bushes or trees on the path to your home.  Use bright outdoor lighting.  Clear any walking paths  of anything that might make someone trip, such as rocks or tools.  Regularly check to see if handrails are loose or broken. Make sure that both sides of any steps have handrails.  Any raised decks and porches should have guardrails on the edges.  Have any leaves, snow, or ice cleared regularly.  Use sand or salt on walking paths during winter.  Clean up any spills in your garage right away. This includes oil or grease spills. What can I do in the bathroom?  Use night lights.  Install grab bars by the toilet and in the tub and shower. Do not use towel bars as grab bars.  Use non-skid mats or decals in the tub or shower.  If you need to sit down in the shower, use a plastic, non-slip stool.  Keep the floor dry. Clean up any water that spills on the floor as soon as it happens.  Remove soap buildup in the tub or shower regularly.  Attach bath mats securely with double-sided non-slip rug tape.  Do not have throw rugs and other things on the floor that can make you trip. What can I do in the bedroom?  Use night lights.  Make sure that you  have a light by your bed that is easy to reach.  Do not use any sheets or blankets that are too big for your bed. They should not hang down onto the floor.  Have a firm chair that has side arms. You can use this for support while you get dressed.  Do not have throw rugs and other things on the floor that can make you trip. What can I do in the kitchen?  Clean up any spills right away.  Avoid walking on wet floors.  Keep items that you use a lot in easy-to-reach places.  If you need to reach something above you, use a strong step stool that has a grab bar.  Keep electrical cords out of the way.  Do not use floor polish or wax that makes floors slippery. If you must use wax, use non-skid floor wax.  Do not have throw rugs and other things on the floor that can make you trip. What can I do with my stairs?  Do not leave any items on  the stairs.  Make sure that there are handrails on both sides of the stairs and use them. Fix handrails that are broken or loose. Make sure that handrails are as long as the stairways.  Check any carpeting to make sure that it is firmly attached to the stairs. Fix any carpet that is loose or worn.  Avoid having throw rugs at the top or bottom of the stairs. If you do have throw rugs, attach them to the floor with carpet tape.  Make sure that you have a light switch at the top of the stairs and the bottom of the stairs. If you do not have them, ask someone to add them for you. What else can I do to help prevent falls?  Wear shoes that: ? Do not have high heels. ? Have rubber bottoms. ? Are comfortable and fit you well. ? Are closed at the toe. Do not wear sandals.  If you use a stepladder: ? Make sure that it is fully opened. Do not climb a closed stepladder. ? Make sure that both sides of the stepladder are locked into place. ? Ask someone to hold it for you, if possible.  Clearly mark and make sure that you can see: ? Any grab bars or handrails. ? First and last steps. ? Where the edge of each step is.  Use tools that help you move around (mobility aids) if they are needed. These include: ? Canes. ? Walkers. ? Scooters. ? Crutches.  Turn on the lights when you go into a dark area. Replace any light bulbs as soon as they burn out.  Set up your furniture so you have a clear path. Avoid moving your furniture around.  If any of your floors are uneven, fix them.  If there are any pets around you, be aware of where they are.  Review your medicines with your doctor. Some medicines can make you feel dizzy. This can increase your chance of falling. Ask your doctor what other things that you can do to help prevent falls. This information is not intended to replace advice given to you by your health care provider. Make sure you discuss any questions you have with your health care  provider. Document Released: 08/12/2009 Document Revised: 03/23/2016 Document Reviewed: 11/20/2014 Elsevier Interactive Patient Education  2018 [place] Maintenance, Male A healthy lifestyle and preventive care is important for your health and wellness. Ask your  health care provider about what schedule of regular examinations is right for you. What should I know about weight and diet? Eat a Healthy Diet  Eat plenty of vegetables, fruits, whole grains, low-fat dairy products, and lean protein.  Do not eat a lot of foods high in solid fats, added sugars, or salt.  Maintain a Healthy Weight Regular exercise can help you achieve or maintain a healthy weight. You should:  Do at least 150 minutes of exercise each week. The exercise should increase your heart rate and make you sweat (moderate-intensity exercise).  Do strength-training exercises at least twice a week.  Watch Your Levels of Cholesterol and Blood Lipids  Have your blood tested for lipids and cholesterol every 5 years starting at 67 years of age. If you are at high risk for heart disease, you should start having your blood tested when you are 67 years old. You may need to have your cholesterol levels checked more often if: ? Your lipid or cholesterol levels are high. ? You are older than 67 years of age. ? You are at high risk for heart disease.  What should I know about cancer screening? Many types of cancers can be detected early and may often be prevented. Lung Cancer  You should be screened every year for lung cancer if: ? You are a current smoker who has smoked for at least 30 years. ? You are a former smoker who has quit within the past 15 years.  Talk to your health care provider about your screening options, when you should start screening, and how often you should be screened.  Colorectal Cancer  Routine colorectal cancer screening usually begins at 67 years of age and should be repeated every  5-10 years until you are 67 years old. You may need to be screened more often if early forms of precancerous polyps or small growths are found. Your health care provider may recommend screening at an earlier age if you have risk factors for colon cancer.  Your health care provider may recommend using home test kits to check for hidden blood in the stool.  A small camera at the end of a tube can be used to examine your colon (sigmoidoscopy or colonoscopy). This checks for the earliest forms of colorectal cancer.  Prostate and Testicular Cancer  Depending on your age and overall health, your health care provider may do certain tests to screen for prostate and testicular cancer.  Talk to your health care provider about any symptoms or concerns you have about testicular or prostate cancer.  Skin Cancer  Check your skin from head to toe regularly.  Tell your health care provider about any new moles or changes in moles, especially if: ? There is a change in a mole's size, shape, or color. ? You have a mole that is larger than a pencil eraser.  Always use sunscreen. Apply sunscreen liberally and repeat throughout the day.  Protect yourself by wearing long sleeves, pants, a wide-brimmed hat, and sunglasses when outside.  What should I know about heart disease, diabetes, and high blood pressure?  If you are 61-81 years of age, have your blood pressure checked every 3-5 years. If you are 49 years of age or older, have your blood pressure checked every year. You should have your blood pressure measured twice-once when you are at a hospital or clinic, and once when you are not at a hospital or clinic. Record the average of the two measurements. To check your  blood pressure when you are not at a hospital or clinic, you can use: ? An automated blood pressure machine at a pharmacy. ? A home blood pressure monitor.  Talk to your health care provider about your target blood pressure.  If you are  between 71-59 years old, ask your health care provider if you should take aspirin to prevent heart disease.  Have regular diabetes screenings by checking your fasting blood sugar level. ? If you are at a normal weight and have a low risk for diabetes, have this test once every three years after the age of 30. ? If you are overweight and have a high risk for diabetes, consider being tested at a younger age or more often.  A one-time screening for abdominal aortic aneurysm (AAA) by ultrasound is recommended for men aged 21-75 years who are current or former smokers. What should I know about preventing infection? Hepatitis B If you have a higher risk for hepatitis B, you should be screened for this virus. Talk with your health care provider to find out if you are at risk for hepatitis B infection. Hepatitis C Blood testing is recommended for:  Everyone born from [birth year] through 1965.  Anyone with known risk factors for hepatitis C.  Sexually Transmitted Diseases (STDs)  You should be screened each year for STDs including gonorrhea and chlamydia if: ? You are sexually active and are younger than 67 years of age. ? You are older than 67 years of age and your health care provider tells you that you are at risk for this type of infection. ? Your sexual activity has changed since you were last screened and you are at an increased risk for chlamydia or gonorrhea. Ask your health care provider if you are at risk.  Talk with your health care provider about whether you are at high risk of being infected with HIV. Your health care provider may recommend a prescription medicine to help prevent HIV infection.  What else can I do?  Schedule regular health, dental, and eye exams.  Stay current with your vaccines (immunizations).  Do not use any tobacco products, such as cigarettes, chewing tobacco, and e-cigarettes. If you need help quitting, ask your health care provider.  Limit alcohol intake to no  more than 2 drinks per day. One drink equals 12 ounces of beer, 5 ounces of wine, or 1 ounces of hard liquor.  Do not use street drugs.  Do not share needles.  Ask your health care provider for help if you need support or information about quitting drugs.  Tell your health care provider if you often feel depressed.  Tell your health care provider if you have ever been abused or do not feel safe at home. This information is not intended to replace advice given to you by your health care provider. Make sure you discuss any questions you have with your health care provider. Document Released: 04/13/2008 Document Revised: 06/14/2016 Document Reviewed: 07/20/2015 Elsevier Interactive Patient Education  [employer].

## 2018-01-29 NOTE — Telephone Encounter (Signed)
Patient in [place] 3/28 for paresthesia left leg 1/22/ 2019 AWV. Stated Dr. Sherren Mocha ordered Xanax 0.5 to take prn as needed; Rx outdated in 2015 but would like a refill. Stated this was controled, on beers list etc and will make an apt with dr. Maudie Mercury to evaluate.   Would like RX refill but stated he would make an apt for medical review, labs and fup regarding numbness in left LLL which was evaluated by Dr. Volanda Napoleon recently   Please advise.

## 2018-01-29 NOTE — Progress Notes (Addendum)
Subjective:   Andrew Mcmahon is a 67 y.o. male who presents for Medicare Annual/Subsequent preventive examination.  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  Cardiac Risk Factors include: advanced age (>4mn, >>29women);male gender  Diet Breakfast oatmeal w cinnamon, dried cherries Ground flaxseed, walnuts, BB, RB and BB  Honey  Lunch; depends on location Dinner goes out some  Vegetables and fruits   Exercise HDL 43 Both walk and swim Swimming since he was 7 Walks 5 miles Swim 30 to 35 minutes Weight bearing  Typically 300 a week    There are no preventive care reminders to display for this patient. PSV 23 given today  Cologuard 09/2016 - 09/2019  Discussed shingrix      Objective:    Vitals: BP 120/76   Pulse (!) 58   Ht _0  (1.803 m)   Wt 205 lb (93 kg)   SpO2 95%   BMI 28.59 kg/m   Body mass index is 28.59 kg/m.  Advanced Directives 01/29/2018 11/23/2016 12/25/2014  Does Patient Have a Medical Advance Directive? Yes No No  Would patient like information on creating a medical advance directive? - - No - patient declined information    Tobacco Social History   Tobacco Use  Smoking Status Never Smoker  Smokeless Tobacco Never Used  Tobacco Comment   parents smoked growing up; stopped when he was a teenager     Counseling given: Yes Comment: parents smoked growing up; stopped when he was a teenager   Clinical Intake:   Past Medical History:  Diagnosis Date  . Back pain    low, hx radiculopathy, saw ortho in the past  . Closed right  ankle fracture 09/2015   per pt due to fall while hiking  . Deviated nasal septum   . GERD (gastroesophageal reflux disease)   . History of kidney stones    kidney stones - h/o   . Kidney stones    history  . 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 Panic attack   . PONV (postoperative nausea and vomiting)   . Right inguinal hernia   . Wears glasses    Past Surgical History:  Procedure Laterality Date  . COLONOSCOPY    . INGUINAL HERNIA REPAIR Right 11/29/2016   Procedure: LAPAROSCOPIC RIGHT  INGUINAL HERNIA;  Surgeon: DCoralie Keens MD;  [facility]  Service: General;  Laterality: Right;  . INSERTION OF MESH Right 11/29/2016   Procedure: INSERTION OF MESH;  Surgeon: DCoralie Keens MD;  [facility]  Service: General;  Laterality: Right;  . MASS EXCISION  08/08/2012   Procedure: MINOR EXCISION OF MASS;  Surgeon: RCammie Sickle, MD;  Location: [facility]  Service: Orthopedics;  Laterality: Right;  Minor Excision of Foreign Body  . MOHS SURGERY    . NASAL SEPTUM SURGERY    . WISDOM TOOTH EXTRACTION  1980   Family History  Problem Relation Age of Onset  . Liver disease Other   .  Multiple myeloma Father        died at 72    Social History   Socioeconomic History  . Marital status: Married    Spouse name: Not on file  . Number of children: 0  . Years of education: Not on file  . Highest education level: Not on file  Occupational History  . Occupation: Optometrist  Social Needs  . Financial resource strain: Not on file  . Food insecurity:    Worry: Not on file    Inability: Not on file  . Transportation needs:    Medical: Not on file    Non-medical: Not on file  Tobacco Use  . Smoking status: Never Smoker  . Smokeless tobacco: Never Used  . Tobacco comment: parents smoked growing up; stopped when he was a teenager  Substance and Sexual Activity  . Alcohol use: Yes    Comment: daily- wine- 2 glasses q day    . Drug use: No    . Sexual activity: Not on file  Lifestyle  . Physical activity:    Days per week: Not on file    Minutes per session: Not on file  . Stress: Not on file  Relationships  . Social connections:    Talks on phone: Not on file    Gets together: Not on file    Attends religious service: Not on file    Active member of club or organization: Not on file    Attends meetings of clubs or organizations: Not on file    Relationship status: Not on file  Other Topics Concern  . Not on file  Social History Narrative  . Not on file    Outpatient Encounter Medications as of 01/29/2018  Medication Sig  . esomeprazole (NEXIUM) 20 MG capsule Take 20 mg by mouth every other day.  . fish oil-omega-3 fatty acids 1000 MG capsule Take 1 g by mouth daily.   . Probiotic Product (PROBIOTIC PO) Take 1 capsule by mouth daily.    No facility-administered encounter medications on file as of 01/29/2018.     Activities of Daily Living In your present state of health, do you have any difficulty performing the following activities: 01/29/2018  Hearing? N  Vision? N  Difficulty concentrating or making decisions? N  Walking or climbing stairs? N  Dressing or bathing? N  Doing errands, shopping? N  Preparing Food and eating ? N  Using the Toilet? N  In the past six months, have you accidently leaked urine? N  Do you have problems with loss of bowel control? N  Managing your Medications? N  Managing your Finances? N  Housekeeping or managing your Housekeeping? N  Some recent data might be hidden    Patient Care Team: Lucretia Kern, DO as PCP - General (Family Medicine)   Assessment:   This is a routine wellness examination for Andrew Mcmahon.  Exercise Activities and Dietary recommendations Current Exercise Habits: Structured exercise class, Time (Minutes): 60, Frequency (Times/Week): 4, Weekly Exercise (Minutes/Week): 240, Intensity: Moderate  Goals    . Patient Stated     Continue to set goals that will  improved your strength, mentally and physically        Fall Risk Fall Risk  01/29/2018 06/12/2016  Falls in the past year? No Yes  Number falls in past yr: - 1  Injury with Fall? - Yes     Depression Screen PHQ 2/9 Scores 01/29/2018 06/12/2016  PHQ - 2 Score 0 0  Cognitive Function Ad8 score reviewed for issues:  Issues making decisions:  Less interest in hobbies / activities:  Repeats questions, stories (family complaining):  Trouble using ordinary gadgets (microwave, computer, phone):  Forgets the month or year:   Mismanaging finances:   Remembering appts:  Daily problems with thinking and/or memory: Ad8 score is=0    MMSE - Mini Mental State Exam 01/29/2018  Not completed: (No Data)        Immunization History  Administered Date(s) Administered  . Influenza Split 08/10/2011  . Influenza Whole 07/22/2010  . Influenza, High Dose Seasonal PF 11/20/2016  . Pneumococcal Conjugate-13 06/12/2016  . Pneumococcal Polysaccharide-23 01/29/2018  . Td 10/30/2002  . Tdap 02/05/2014  . Zoster 08/10/2011      Screening Tests Health Maintenance  Topic Date Due  . INFLUENZA VACCINE  05/30/2018  . Fecal DNA (Cologuard)  10/13/2019  . TETANUS/TDAP  02/06/2024  . Hepatitis C Screening  Completed  . PNA vac Low Risk Adult  Completed         Plan:      PCP Notes   Health Maintenance Educated regarding shingrix; Has PSV 23 today  Abnormal Screens  none  Referrals  none  Patient concerns; Would like Rx filled aprazolam .5 Last ordered was 2015  Takes very infrequently   OV 3/28 for paresthesia left leg 1/22/ 2018   Nurse Concerns; As noted   Next PCP apt Agreed to schedule an apt with Dr. Maudie Mercury for fup on his medical issues, as well as Xaxax 0.5 refill (basket note to be sent for refill)  The patient was told that Dr. Maudie Mercury may want to see him and evaluate prior to re-order as the rx was outdated in 2015.  Agrees to make a medical apt for fup  of annual labs, as well as recent c/o x 2 weeks of paresthesia in left lower leg; shin area And fup on xanax refill     I have personally reviewed and noted the following in the patient's chart:   . Medical and social history . Use of alcohol, tobacco or illicit drugs  . Current medications and supplements . Functional ability and status . Nutritional status . Physical activity . Advanced directives . List of other physicians . Hospitalizations, surgeries, and ER visits in previous 12 months . Vitals . Screenings to include cognitive, depression, and falls . Referrals and appointments  In addition, I have reviewed and discussed with patient certain preventive protocols, quality metrics, and best practice recommendations. A written personalized care plan for preventive services as well as general preventive health recommendations were provided to patient.     Andrew Fines, RN  01/29/2018  04/05 called to Mr. Hackler and left a message to schedule apt with Dr. Maudie Mercury to evaluate medicine as discussed at his wellness exam. Left my direct line for call back Andrew Schwartz Jashon Ishida RN

## 2018-01-30 NOTE — Telephone Encounter (Signed)
Yes. Agree, advise appt. I don't recommend or prescribe that medication usually - but we can see, eval and try to figure out what would be the best option. Thanks.

## 2018-01-30 NOTE — Progress Notes (Signed)
Andrew Baldridge R Emmerich Cryer, DO  

## 2018-02-27 ENCOUNTER — Ambulatory Visit (INDEPENDENT_AMBULATORY_CARE_PROVIDER_SITE_OTHER): Payer: Self-pay | Admitting: *Deleted

## 2018-02-27 DIAGNOSIS — I781 Nevus, non-neoplastic: Secondary | ICD-10-CM

## 2018-02-27 NOTE — Progress Notes (Signed)
   Cutaneous Laser:pulsed mode  810j/cm2 400 ms delay  13 ms Duration 0.5 spot  Total pulses: 409 Total energy 473  Total time::04  Photos: yes but they were not saved, so could not attach here    Treated nose, cheeks, sides of face, and chin with the CL. Good local rxn. Tol well. Anticipate good results.

## 2018-03-19 ENCOUNTER — Encounter: Payer: Self-pay | Admitting: Family Medicine

## 2018-03-19 ENCOUNTER — Ambulatory Visit (INDEPENDENT_AMBULATORY_CARE_PROVIDER_SITE_OTHER): Payer: Medicare Other | Admitting: Family Medicine

## 2018-03-19 VITALS — BP 100/80 | HR 65 | Temp 97.9°F | Ht 70.25 in | Wt 203.7 lb

## 2018-03-19 DIAGNOSIS — E785 Hyperlipidemia, unspecified: Secondary | ICD-10-CM

## 2018-03-19 DIAGNOSIS — Z125 Encounter for screening for malignant neoplasm of prostate: Secondary | ICD-10-CM

## 2018-03-19 DIAGNOSIS — R972 Elevated prostate specific antigen [PSA]: Secondary | ICD-10-CM

## 2018-03-19 DIAGNOSIS — H6122 Impacted cerumen, left ear: Secondary | ICD-10-CM | POA: Diagnosis not present

## 2018-03-19 DIAGNOSIS — Z85828 Personal history of other malignant neoplasm of skin: Secondary | ICD-10-CM

## 2018-03-19 DIAGNOSIS — R739 Hyperglycemia, unspecified: Secondary | ICD-10-CM | POA: Diagnosis not present

## 2018-03-19 DIAGNOSIS — F410 Panic disorder [episodic paroxysmal anxiety] without agoraphobia: Secondary | ICD-10-CM

## 2018-03-19 MED ORDER — ALPRAZOLAM 0.25 MG PO TABS: 0.2500 mg | ORAL_TABLET | Freq: Two times a day (BID) | ORAL | 0 refills | Status: DC | PRN

## 2018-03-19 NOTE — Progress Notes (Signed)
HPI:  Using dictation device. Unfortunately this device frequently misinterprets words/phrases.  Has straight medicare and already had annual wellness visit but reports is here today for physical. Will do follow up instead. Due for lipid panel  Cerumen impaction L ear - desires removal.  Panic disorder: -when driving in traffic -has used as needed, rare benzo for this requesting refill  Hx prostatitis and elevated PSH: -2x normal biopsies and he was upset biopsy done for only mildly elevated PSH -he prefers to check PSA here, understands limitations of prostate ca screening and risks  GERD: -meds: nexium  Elevated BMI/Hyperlipidemia: -takes fish oil -exercises a lot - swimming and or walking daily (4-6 miles), lifts wts, golfs -feels he eats healthy  Sees dermatologist - hx BCC   ROS: See pertinent positives and negatives per HPI.  Past Medical History:  Diagnosis Date  . Back pain    low, hx radiculopathy, saw ortho in the past  . Closed right ankle fracture 09/2015   per pt due to fall while hiking  . Deviated nasal septum   . GERD (gastroesophageal reflux disease)   . History of kidney stones    kidney stones - h/o   . Kidney stones    history  . 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 Panic attack   . PONV (postoperative nausea and vomiting)   . Right inguinal hernia   . Wears glasses     Past Surgical History:  Procedure Laterality Date  . COLONOSCOPY    . INGUINAL HERNIA REPAIR Right 11/29/2016   Procedure: LAPAROSCOPIC RIGHT  INGUINAL HERNIA;  Surgeon: Coralie Keens, MD;  Location: [place];  Service: General;  Laterality: Right;  . INSERTION OF MESH Right 11/29/2016   Procedure: INSERTION OF MESH;  Surgeon: Coralie Keens, MD;  Location: [place];  Service: General;  Laterality: Right;  . MASS EXCISION  08/08/2012   Procedure: MINOR EXCISION OF MASS;  Surgeon: Cammie Sickle., MD;  Location: [place];  Service:  Orthopedics;  Laterality: Right;  Minor Excision of Foreign Body  . MOHS SURGERY    . NASAL SEPTUM SURGERY    . WISDOM TOOTH EXTRACTION  1980    Family History  Problem Relation Age of Onset  . Liver disease Other   . Multiple myeloma Father        died at 85     SOCIAL HX: see hpi   Current Outpatient Medications:  .  esomeprazole (NEXIUM) 20 MG capsule, Take 20 mg by mouth every other day., Disp: , Rfl:  .  fish oil-omega-3 fatty acids 1000 MG capsule, Take 1 g by mouth daily. , Disp: , Rfl:  .  Probiotic Product (PROBIOTIC PO), Take 1 capsule by mouth daily. , Disp: , Rfl:  .  ALPRAZolam (XANAX) 0.25 MG tablet, Take 1 tablet (0.25 mg total) by mouth 2 (two) times daily as needed for anxiety. For panic., Disp: 10 tablet, Rfl: 0  EXAM:  Vitals:   03/19/18 1443  BP: 100/80  Pulse: 65  Temp: 97.9 F (36.6 C)    Body mass index is 29.02 kg/m.  GENERAL: vitals reviewed and listed above, alert, oriented, appears well hydrated and in no acute distress  HEENT: atraumatic, conjunttiva clear, no obvious abnormalities on inspection of external nose and ears, cerumen impaction L - after removal normal ear canal and TM, o/w normal inspection ears, nose, moth  NECK: no obvious masses on inspection  LUNGS: clear to auscultation bilaterally, no wheezes, rales or rhonchi, good air movement  CV: HRRR, no peripheral edema  ABD: soft, NTTP  MS: moves all extremities without noticeable abnormality  PSYCH: pleasant and cooperative, no obvious depression or anxiety  ASSESSMENT AND PLAN:  Discussed the following assessment and plan:  Hyperlipidemia, unspecified hyperlipidemia type - Plan: Lipid panel  Prostate cancer screening - Plan: PSA  Panic disorder  Impacted cerumen of left ear  Hyperglycemia - Plan: Hemoglobin A1c  Elevated PSA - Plan: PSA  SKIN CANCER, HX OF  -ear lavage did not remove wax so he opted for removal with soft curette with resolution of issue,  tolerated well -labs per orders, discussed risks/benefits prostate ca screening -congratulated on regular exercise and healthy diet, encouraged him to continue -discussed sig risks and proper use benzos, okd rx for small amount after lengthy discussion, advise cbt which he declined -Patient advised to return or notify a doctor immediately if symptoms worsen or persist or new concerns arise.  Patient Instructions  BEFORE YOU LEAVE: -fasting lab visit for lipid panel/labs -remove ear wax -follow up: 6 months   Use caution with xanax medication. Do not take with other sedating substances such as alcohol or other medications that cause sedation. Use extreme caution if driving or operating machinery. Keep prescription away from others and only use as instructed.   We recommend the following healthy lifestyle for LIFE: 1) Small portions. But, make sure to get regular (at least 3 per day), healthy meals and small healthy snacks if needed.  2) Eat a healthy clean diet.   TRY TO EAT: -at least 5-7 servings of low sugar, colorful, and nutrient rich vegetables per day (not corn, potatoes or bananas.) -berries are the best choice if you wish to eat fruit (only eat small amounts if trying to reduce weight)  -lean meets (fish, white meat of chicken or [place]) -vegan proteins for some meals - beans or tofu, whole grains, nuts and seeds -Replace bad fats with good fats - good fats include: fish, nuts and seeds, canola oil, olive oil -small amounts of low fat or non fat dairy -small amounts of100 % whole grains - check the lables -drink plenty of water  AVOID: -SUGAR, sweets, anything with added sugar, corn syrup or sweeteners - must read labels as even foods advertised as "healthy" often are loaded with sugar -if you must have a sweetener, small amounts of stevia may be best -sweetened beverages and artificially sweetened beverages -simple starches (rice, bread, potatoes, pasta, chips, etc - small  amounts of 100% whole grains are ok) -red meat, pork, butter -fried foods, fast food, processed food, excessive dairy, eggs and coconut.  3)Get at least 150 minutes of sweaty aerobic exercise per week.  4)Reduce stress - consider counseling, meditation and relaxation to balance other aspects of your life.     Lucretia Kern, DO

## 2018-03-19 NOTE — Patient Instructions (Addendum)
BEFORE YOU LEAVE: -fasting lab visit for lipid panel/labs -remove ear wax -follow up: 6 months   Use caution with xanax medication. Do not take with other sedating substances such as alcohol or other medications that cause sedation. Use extreme caution if driving or operating machinery. Keep prescription away from others and only use as instructed.   We recommend the following healthy lifestyle for LIFE: 1) Small portions. But, make sure to get regular (at least 3 per day), healthy meals and small healthy snacks if needed.  2) Eat a healthy clean diet.   TRY TO EAT: -at least 5-7 servings of low sugar, colorful, and nutrient rich vegetables per day (not corn, potatoes or bananas.) -berries are the best choice if you wish to eat fruit (only eat small amounts if trying to reduce weight)  -lean meets (fish, white meat of chicken or [place]) -vegan proteins for some meals - beans or tofu, whole grains, nuts and seeds -Replace bad fats with good fats - good fats include: fish, nuts and seeds, canola oil, olive oil -small amounts of low fat or non fat dairy -small amounts of100 % whole grains - check the lables -drink plenty of water  AVOID: -SUGAR, sweets, anything with added sugar, corn syrup or sweeteners - must read labels as even foods advertised as "healthy" often are loaded with sugar -if you must have a sweetener, small amounts of stevia may be best -sweetened beverages and artificially sweetened beverages -simple starches (rice, bread, potatoes, pasta, chips, etc - small amounts of 100% whole grains are ok) -red meat, pork, butter -fried foods, fast food, processed food, excessive dairy, eggs and coconut.  3)Get at least 150 minutes of sweaty aerobic exercise per week.  4)Reduce stress - consider counseling, meditation and relaxation to balance other aspects of your life.

## 2018-03-21 ENCOUNTER — Other Ambulatory Visit: Payer: Medicare Other

## 2018-03-21 LAB — LIPID PANEL
CHOLESTEROL: 189 mg/dL (ref 0–200)
HDL: 34.2 mg/dL — AB (ref >39.00)
LDL Cholesterol: 130 mg/dL — ABNORMAL HIGH (ref 0–99)
NonHDL: 154.36
TRIGLYCERIDES: 124 mg/dL (ref 0.0–149.0)
Total CHOL/HDL Ratio: 6
VLDL: 24.8 mg/dL (ref 0.0–40.0)

## 2018-03-21 LAB — PSA: PSA: 2.85 ng/mL (ref 0.10–4.00)

## 2018-03-21 LAB — HEMOGLOBIN A1C: Hgb A1c MFr Bld: 5.5 % (ref 4.6–6.5)

## 2018-06-17 DIAGNOSIS — H2513 Age-related nuclear cataract, bilateral: Secondary | ICD-10-CM | POA: Diagnosis not present

## 2018-06-17 DIAGNOSIS — H43813 Vitreous degeneration, bilateral: Secondary | ICD-10-CM | POA: Diagnosis not present

## 2018-06-17 DIAGNOSIS — H524 Presbyopia: Secondary | ICD-10-CM | POA: Diagnosis not present

## 2018-06-17 DIAGNOSIS — H04123 Dry eye syndrome of bilateral lacrimal glands: Secondary | ICD-10-CM | POA: Diagnosis not present

## 2018-07-30 DIAGNOSIS — D485 Neoplasm of uncertain behavior of skin: Secondary | ICD-10-CM | POA: Diagnosis not present

## 2018-07-30 DIAGNOSIS — L570 Actinic keratosis: Secondary | ICD-10-CM | POA: Diagnosis not present

## 2018-07-30 DIAGNOSIS — L820 Inflamed seborrheic keratosis: Secondary | ICD-10-CM | POA: Diagnosis not present

## 2018-07-30 DIAGNOSIS — D1801 Hemangioma of skin and subcutaneous tissue: Secondary | ICD-10-CM | POA: Diagnosis not present

## 2018-07-30 DIAGNOSIS — L812 Freckles: Secondary | ICD-10-CM | POA: Diagnosis not present

## 2018-07-30 DIAGNOSIS — Z85828 Personal history of other malignant neoplasm of skin: Secondary | ICD-10-CM | POA: Diagnosis not present

## 2018-07-30 DIAGNOSIS — L821 Other seborrheic keratosis: Secondary | ICD-10-CM | POA: Diagnosis not present

## 2018-09-18 NOTE — Progress Notes (Signed)
HPI:  Using dictation device. Unfortunately this device frequently misinterprets words/phrases.  Andrew Mcmahon is a pleasant 67 y.o. here for follow up. Chronic medical problems summarized below were reviewed for changes and stability and were updated as needed below.  Wants me to check skin lesion temp and freeze if needed - sees derm but forgot to mention. Mucous cyst toe. Drained in the past and returned. Saw foot specialist Dr. Doran Durand and was told if desired removal would be surgery - he is not sure if he wants to do surgery but it is annoying.These issues and their treatment remain stable for the most part.   Due for flu shot  Skin lesion L temporal: -sees dermatologist -forgot to mention this -a little scaly, wants me to check -noticed in last few months  Panic disorder: -has used as needed, rare benzo, for this - low dose and no sedation  Hx prostatitis and elevated PSH: -2x normal biopsies and he was upset biopsy done for only mildly elevated PSH -he plans to follow up with urologist as he is happy prior urologist retired  GERD: -meds: nexium  Elevated BMI/Hyperlipidemia: -takes fish oil -exercises a lot - swimming and or walking daily (4-6 miles), lifts wts, golfs -feels he eats healthy  Sees dermatologist - hx BCC   ROS: See pertinent positives and negatives per HPI.  Past Medical History:  Diagnosis Date  . Back pain    low, hx radiculopathy, saw ortho in the past  . Closed right ankle fracture 09/2015   per pt due to fall while hiking  . Deviated nasal septum   . GERD (gastroesophageal reflux disease)   . History of kidney stones    kidney stones - h/o   . Kidney stones    history  . 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 Panic attack   . PONV (postoperative nausea and vomiting)   . Right inguinal hernia   . Wears glasses     Past Surgical History:  Procedure Laterality Date  . COLONOSCOPY    . INGUINAL HERNIA REPAIR Right 11/29/2016    Procedure: LAPAROSCOPIC RIGHT  INGUINAL HERNIA;  Surgeon: Coralie Keens, MD;  Location: [place];  Service: General;  Laterality: Right;  . INSERTION OF MESH Right 11/29/2016   Procedure: INSERTION OF MESH;  Surgeon: Coralie Keens, MD;  Location: [place];  Service: General;  Laterality: Right;  . MASS EXCISION  08/08/2012   Procedure: MINOR EXCISION OF MASS;  Surgeon: Cammie Sickle., MD;  Location: [place];  Service: Orthopedics;  Laterality: Right;  Minor Excision of Foreign Body  . MOHS SURGERY    . NASAL SEPTUM SURGERY    . WISDOM TOOTH EXTRACTION  1980    Family History  Problem Relation Age of Onset  . Liver disease Other   . Multiple myeloma Father        died at 40     SOCIAL HX: see hpi   Current Outpatient Medications:  .  ALPRAZolam (XANAX) 0.25 MG tablet, Take 1 tablet (0.25 mg total) by mouth 2 (two) times daily as needed for anxiety. For panic., Disp: 10 tablet, Rfl: 0 .  esomeprazole (NEXIUM) 20 MG capsule, Take 20 mg by mouth every other day., Disp: , Rfl:  .  fish oil-omega-3 fatty acids 1000 MG capsule, Take 1 g by mouth daily. , Disp: , Rfl:  .  Probiotic Product (PROBIOTIC PO), Take 1 capsule by mouth  daily. , Disp: , Rfl:   EXAM:  Vitals:   09/19/18 0857  BP: 110/80  Pulse: 62  Temp: 98.4 F (36.9 C)    Body mass index is 29.18 kg/m.  GENERAL: vitals reviewed and listed above, alert, oriented, appears well hydrated and in no acute distress  HEENT: atraumatic, conjunttiva clear, no obvious abnormalities on inspection of external nose and ears  NECK: no obvious masses on inspection  LUNGS: clear to auscultation bilaterally, no wheezes, rales or rhonchi, good air movement  CV: HRRR, no peripheral edema  SKIN: hyperkeratotic small scally papule x2 R temp - ~3-4 mm in diameter, cyst on toe  MS: moves all extremities without noticeable abnormality  PSYCH: pleasant and cooperative, no obvious depression or  anxiety  ASSESSMENT AND PLAN:  Discussed the following assessment and plan:  AK (actinic keratosis)  SK (seborrheic keratosis)  Hyperlipidemia, unspecified hyperlipidemia type  Overweight (BMI 25.0-29.9)  -discussed potential etiologies and tx temporal lesions - he opted to try tx with LN2. Advised recheck with me or derm in 1 month to ensure resolved. -lifestyle recs -advised follow up with foot specialist about the cyst -he plans to follow up with urology -Patient advised to return or notify a doctor immediately if symptoms worsen or persist or new concerns arise.  Patient Instructions  BEFORE YOU LEAVE: -flu shot -follow up: annual exam in April  Follow up in 1 month about skin lesion here or with dermatology  See foot specialist if you desire removal cyst on foot  We have ordered labs or studies at this visit. It can take up to 1-2 weeks for results and processing. IF results require follow up or explanation, we will call you with instructions. Clinically stable results will be released to your [facility]. If you have not heard from [place] or cannot find your results in [facility] in 2 weeks please contact our office at [phone].  If you are not yet signed up for [facility], please consider signing up.   We recommend the following healthy lifestyle for LIFE: 1) Small portions. But, make sure to get regular (at least 3 per day), healthy meals and small healthy snacks if needed.  2) Eat a healthy clean diet.   TRY TO EAT: -at least 5-7 servings of low sugar, colorful, and nutrient rich vegetables per day (not corn, potatoes or bananas.) -berries are the best choice if you wish to eat fruit (only eat small amounts if trying to reduce weight)  -lean meets (fish, white meat of chicken or [place]) -vegan proteins for some meals - beans or tofu, whole grains, nuts and seeds -Replace bad fats with good fats - good fats include: fish, nuts and seeds, canola oil, olive oil -small amounts  of low fat or non fat dairy -small amounts of100 % whole grains - check the lables -drink plenty of water  AVOID: -SUGAR, sweets, anything with added sugar, corn syrup or sweeteners - must read labels as even foods advertised as "healthy" often are loaded with sugar -if you must have a sweetener, small amounts of stevia may be best -sweetened beverages and artificially sweetened beverages -simple starches (rice, bread, potatoes, pasta, chips, etc - small amounts of 100% whole grains are ok) -red meat, pork, butter -fried foods, fast food, processed food, excessive dairy, eggs and coconut.  3)Get at least 150 minutes of sweaty aerobic exercise per week.  4)Reduce stress - consider counseling, meditation and relaxation to balance other aspects of your life.  Malayasia Mirkin R Tila Millirons, DO   

## 2018-09-19 ENCOUNTER — Encounter: Payer: Self-pay | Admitting: Family Medicine

## 2018-09-19 ENCOUNTER — Ambulatory Visit (INDEPENDENT_AMBULATORY_CARE_PROVIDER_SITE_OTHER): Payer: Medicare Other | Admitting: Family Medicine

## 2018-09-19 VITALS — BP 110/80 | HR 62 | Temp 98.4°F | Ht 70.25 in | Wt 204.8 lb

## 2018-09-19 DIAGNOSIS — Z23 Encounter for immunization: Secondary | ICD-10-CM | POA: Diagnosis not present

## 2018-09-19 DIAGNOSIS — L821 Other seborrheic keratosis: Secondary | ICD-10-CM

## 2018-09-19 DIAGNOSIS — L570 Actinic keratosis: Secondary | ICD-10-CM | POA: Diagnosis not present

## 2018-09-19 DIAGNOSIS — E785 Hyperlipidemia, unspecified: Secondary | ICD-10-CM | POA: Diagnosis not present

## 2018-09-19 DIAGNOSIS — E663 Overweight: Secondary | ICD-10-CM | POA: Diagnosis not present

## 2018-09-19 NOTE — Patient Instructions (Signed)
BEFORE YOU LEAVE: -flu shot -follow up: annual exam in April  Follow up in 1 month about skin lesion here or with dermatology  See foot specialist if you desire removal cyst on foot  We have ordered labs or studies at this visit. It can take up to 1-2 weeks for results and processing. IF results require follow up or explanation, we will call you with instructions. Clinically stable results will be released to your [facility]. If you have not heard from [place] or cannot find your results in [facility] in 2 weeks please contact our office at [phone].  If you are not yet signed up for [facility], please consider signing up.   We recommend the following healthy lifestyle for LIFE: 1) Small portions. But, make sure to get regular (at least 3 per day), healthy meals and small healthy snacks if needed.  2) Eat a healthy clean diet.   TRY TO EAT: -at least 5-7 servings of low sugar, colorful, and nutrient rich vegetables per day (not corn, potatoes or bananas.) -berries are the best choice if you wish to eat fruit (only eat small amounts if trying to reduce weight)  -lean meets (fish, white meat of chicken or [place]) -vegan proteins for some meals - beans or tofu, whole grains, nuts and seeds -Replace bad fats with good fats - good fats include: fish, nuts and seeds, canola oil, olive oil -small amounts of low fat or non fat dairy -small amounts of100 % whole grains - check the lables -drink plenty of water  AVOID: -SUGAR, sweets, anything with added sugar, corn syrup or sweeteners - must read labels as even foods advertised as "healthy" often are loaded with sugar -if you must have a sweetener, small amounts of stevia may be best -sweetened beverages and artificially sweetened beverages -simple starches (rice, bread, potatoes, pasta, chips, etc - small amounts of 100% whole grains are ok) -red meat, pork, butter -fried foods, fast food, processed food, excessive dairy, eggs and coconut.  3)Get at  least 150 minutes of sweaty aerobic exercise per week.  4)Reduce stress - consider counseling, meditation and relaxation to balance other aspects of your life.

## 2018-12-02 DIAGNOSIS — Z85828 Personal history of other malignant neoplasm of skin: Secondary | ICD-10-CM | POA: Diagnosis not present

## 2018-12-02 DIAGNOSIS — M7130 Other bursal cyst, unspecified site: Secondary | ICD-10-CM | POA: Diagnosis not present

## 2018-12-27 DIAGNOSIS — Z125 Encounter for screening for malignant neoplasm of prostate: Secondary | ICD-10-CM | POA: Diagnosis not present

## 2018-12-27 DIAGNOSIS — Z87442 Personal history of urinary calculi: Secondary | ICD-10-CM | POA: Diagnosis not present

## 2019-01-22 ENCOUNTER — Telehealth: Payer: Self-pay

## 2019-01-22 NOTE — Telephone Encounter (Signed)
Author phoned pt. to offer virtual awv with PCP. No answer, author left detailed VM asking for return call to let us know preference to schedule virtual awv with dr. Maudie Mercury or reschedule with health coach for later in the year.

## 2019-02-04 ENCOUNTER — Ambulatory Visit: Payer: Medicare Other

## 2019-05-09 ENCOUNTER — Telehealth: Payer: Self-pay | Admitting: Family Medicine

## 2019-05-09 NOTE — Telephone Encounter (Signed)
Left message on machine for patient.  Just need to know if the appointment on Tuesday will be virtual or in office.

## 2019-05-09 NOTE — Telephone Encounter (Signed)
Patient is calling to reschedule his TOC appt next week with  Dr Jerilee Hoh. The patient states he is flexible with days and times in September. Please advise [phone]

## 2019-05-13 ENCOUNTER — Encounter: Payer: Medicare Other | Admitting: Internal Medicine

## 2019-06-23 DIAGNOSIS — H43813 Vitreous degeneration, bilateral: Secondary | ICD-10-CM | POA: Diagnosis not present

## 2019-06-23 DIAGNOSIS — H25813 Combined forms of age-related cataract, bilateral: Secondary | ICD-10-CM | POA: Diagnosis not present

## 2019-06-23 DIAGNOSIS — H5203 Hypermetropia, bilateral: Secondary | ICD-10-CM | POA: Diagnosis not present

## 2019-06-23 DIAGNOSIS — H04123 Dry eye syndrome of bilateral lacrimal glands: Secondary | ICD-10-CM | POA: Diagnosis not present

## 2019-07-16 ENCOUNTER — Encounter: Payer: Medicare Other | Admitting: Internal Medicine

## 2019-07-29 ENCOUNTER — Ambulatory Visit (INDEPENDENT_AMBULATORY_CARE_PROVIDER_SITE_OTHER): Payer: Medicare Other | Admitting: Internal Medicine

## 2019-07-29 ENCOUNTER — Other Ambulatory Visit: Payer: Self-pay

## 2019-07-29 ENCOUNTER — Encounter: Payer: Self-pay | Admitting: Internal Medicine

## 2019-07-29 VITALS — HR 59 | Temp 97.2°F

## 2019-07-29 DIAGNOSIS — Z6828 Body mass index (BMI) 28.0-28.9, adult: Secondary | ICD-10-CM | POA: Diagnosis not present

## 2019-07-29 DIAGNOSIS — Z23 Encounter for immunization: Secondary | ICD-10-CM | POA: Diagnosis not present

## 2019-07-29 DIAGNOSIS — K219 Gastro-esophageal reflux disease without esophagitis: Secondary | ICD-10-CM | POA: Diagnosis not present

## 2019-07-29 DIAGNOSIS — E785 Hyperlipidemia, unspecified: Secondary | ICD-10-CM | POA: Diagnosis not present

## 2019-07-29 NOTE — Progress Notes (Signed)
Virtual Visit via Video Note  I connected with Andrew Mcmahon on 07/29/19 at  8:30 AM EDT by a video enabled telemedicine application and verified that I am speaking with the correct person using two identifiers.  Location patient: home Location provider: work office Persons participating in the virtual visit: patient, provider  I discussed the limitations of evaluation and management by telemedicine and the availability of in person appointments. The patient expressed understanding and agreed to proceed.   HPI:   Andrew Mcmahon is a 68 y.o. male who is coming in today for the above mentioned reasons.  His only past medical history significant for hyperlipidemia, GERD and prostatitis followed by urology.  He has had 2- biopsies for elevated PSAs that have been negative.  He takes 20 mg of Nexium over-the-counter every other day for GERD.  His symptoms have been pretty well controlled until the past week or 2 where he has noticed some increased upper abdominal pain that feels "sour".  He is retired, married, no children.  His family history significant for father with multiple myeloma and a mother who is still alive at age 10 and well.  He is a never smoker, has 1 glass of scotch every night.  He is an avid exerciser, swimmer, hikes about 4 to 7 miles on a daily basis.  He sees a dermatologist yearly, he has routine eye and dental exams.  His only medications include Nexium as well as some supplements including fish oil, probiotics and vitamin D.    ROS: Constitutional: Denies fever, chills, diaphoresis, appetite change and fatigue.  HEENT: Denies photophobia, eye pain, redness, hearing loss, ear pain, congestion, sore throat, rhinorrhea, sneezing, mouth sores, trouble swallowing, neck pain, neck stiffness and tinnitus.   Respiratory: Denies SOB, DOE, cough, chest tightness,  and wheezing.   Cardiovascular: Denies chest pain, palpitations and leg swelling.  Gastrointestinal: Denies nausea,  vomiting, diarrhea, constipation, blood in stool and abdominal distention.  Genitourinary: Denies dysuria, urgency, frequency, hematuria, flank pain and difficulty urinating.  Endocrine: Denies: hot or cold intolerance, sweats, changes in hair or nails, polyuria, polydipsia. Musculoskeletal: Denies myalgias, back pain, joint swelling, arthralgias and gait problem.  Skin: Denies pallor, rash and wound.  Neurological: Denies dizziness, seizures, syncope, weakness, light-headedness, numbness and headaches.  Hematological: Denies adenopathy. Easy bruising, personal or family bleeding history  Psychiatric/Behavioral: Denies suicidal ideation, mood changes, confusion, nervousness, sleep disturbance and agitation   Past Medical History:  Diagnosis Date  . Back pain    low, hx radiculopathy, saw ortho in the past  . Closed right ankle fracture 09/2015   per pt due to fall while hiking  . Deviated nasal septum   . GERD (gastroesophageal reflux disease)   . History of kidney stones    kidney stones - h/o   . Kidney stones    history  . 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 Panic attack   . PONV (postoperative nausea and vomiting)   . Right inguinal hernia   . Wears glasses     Past Surgical History:  Procedure Laterality Date  . COLONOSCOPY    . INGUINAL HERNIA REPAIR Right 11/29/2016   Procedure: LAPAROSCOPIC RIGHT  INGUINAL HERNIA;  Surgeon: Coralie Keens, MD;  Location: [place];  Service: General;  Laterality: Right;  . INSERTION OF MESH Right 11/29/2016   Procedure: INSERTION OF MESH;  Surgeon: Coralie Keens, MD;  Location: [place];  Service: General;  Laterality:  Right;  [name] Kitchen MASS EXCISION  08/08/2012   Procedure: MINOR EXCISION OF MASS;  Surgeon: Cammie Sickle., MD;  Location: [place];  Service: Orthopedics;  Laterality: Right;  Minor Excision of Foreign Body  . MOHS SURGERY    . NASAL SEPTUM SURGERY    . WISDOM TOOTH EXTRACTION  1980    Family  History  Problem Relation Age of Onset  . Liver disease Other   . Multiple myeloma Father        died at 97     SOCIAL HX:   reports that he has never smoked. He has never used smokeless tobacco. He reports current alcohol use. He reports that he does not use drugs.   Current Outpatient Medications:  .  ALPRAZolam (XANAX) 0.25 MG tablet, Take 1 tablet (0.25 mg total) by mouth 2 (two) times daily as needed for anxiety. For panic., Disp: 10 tablet, Rfl: 0 .  esomeprazole (NEXIUM) 20 MG capsule, Take 20 mg by mouth every other day., Disp: , Rfl:  .  fish oil-omega-3 fatty acids 1000 MG capsule, Take 1 g by mouth daily. , Disp: , Rfl:  .  Probiotic Product (PROBIOTIC PO), Take 1 capsule by mouth daily. , Disp: , Rfl:   EXAM:   VITALS per patient if applicable: None reported  GENERAL: alert, oriented, appears well and in no acute distress  HEENT: atraumatic, conjunttiva clear, no obvious abnormalities on inspection of external nose and ears, Wears corrective lenses  NECK: normal movements of the head and neck  LUNGS: on inspection no signs of respiratory distress, breathing rate appears normal, no obvious gross increased work of breathing, gasping or wheezing  CV: no obvious cyanosis  MS: moves all visible extremities without noticeable abnormality  PSYCH/NEURO: pleasant and cooperative, no obvious depression or anxiety, speech and thought processing grossly intact  ASSESSMENT AND PLAN:   Hyperlipidemia, unspecified hyperlipidemia type -Last LDL was 130 in May 2019. -Will check lipids with next physical, not on medications other than fish oil.  Gastroesophageal reflux disease without esophagitis -Since he is still having some upper abdominal pain with a sour taste, have advised him to take Nexium every day instead of every other day, also counseled on diet and food triggers.  BMI 28.0-28.9,adult -Healthy lifestyle discussed in detail today.     I discussed the assessment  and treatment plan with the patient. The patient was provided an opportunity to ask questions and all were answered. The patient agreed with the plan and demonstrated an understanding of the instructions.   The patient was advised to call back or seek an in-person evaluation if the symptoms worsen or if the condition fails to improve as anticipated.    Lelon Frohlich, MD  [facility]

## 2019-08-07 DIAGNOSIS — Z85828 Personal history of other malignant neoplasm of skin: Secondary | ICD-10-CM | POA: Diagnosis not present

## 2019-08-07 DIAGNOSIS — L812 Freckles: Secondary | ICD-10-CM | POA: Diagnosis not present

## 2019-08-07 DIAGNOSIS — D1801 Hemangioma of skin and subcutaneous tissue: Secondary | ICD-10-CM | POA: Diagnosis not present

## 2019-08-07 DIAGNOSIS — L570 Actinic keratosis: Secondary | ICD-10-CM | POA: Diagnosis not present

## 2019-08-07 DIAGNOSIS — L578 Other skin changes due to chronic exposure to nonionizing radiation: Secondary | ICD-10-CM | POA: Diagnosis not present

## 2019-08-07 DIAGNOSIS — L821 Other seborrheic keratosis: Secondary | ICD-10-CM | POA: Diagnosis not present

## 2019-11-20 ENCOUNTER — Ambulatory Visit: Payer: Medicare Other | Attending: Internal Medicine

## 2019-11-20 DIAGNOSIS — Z23 Encounter for immunization: Secondary | ICD-10-CM | POA: Insufficient documentation

## 2019-11-20 NOTE — Progress Notes (Signed)
   Covid-19 Vaccination Clinic  Name:  BRACE LETOURNEAU    MRN: [id number] DOB: Oct 09, 1951  11/20/2019  Mr. Marn was observed post Covid-19 immunization for 15 minutes without incidence. He was provided with Vaccine Information Sheet and instruction to access the V-Safe system.   Mr. Hebeler was instructed to call 911 with any severe reactions post vaccine: [name] Kitchen Difficulty breathing  . Swelling of your face and throat  . A fast heartbeat  . A bad rash all over your body  . Dizziness and weakness    Immunizations Administered    Name Date Dose VIS Date Route   Pfizer COVID-19 Vaccine 11/20/2019 12:46 PM 0.3 mL 10/10/2019 Intramuscular   Manufacturer: [place]   Lot: [id number]   [place]: [id number]

## 2019-12-11 ENCOUNTER — Other Ambulatory Visit: Payer: Self-pay

## 2019-12-11 ENCOUNTER — Ambulatory Visit: Payer: Medicare Other | Attending: Internal Medicine

## 2019-12-11 DIAGNOSIS — Z23 Encounter for immunization: Secondary | ICD-10-CM | POA: Insufficient documentation

## 2019-12-11 NOTE — Progress Notes (Signed)
   Covid-19 Vaccination Clinic  Name:  Andrew Mcmahon    MRN: [id number] DOB: Jun 16, 1951  12/11/2019  [name] was observed post Covid-19 immunization for 15 minutes without incidence. He was provided with Vaccine Information Sheet and instruction to access the V-Safe system.   [name] was instructed to call 911 with any severe reactions post vaccine: [name] Kitchen Difficulty breathing  . Swelling of your face and throat  . A fast heartbeat  . A bad rash all over your body  . Dizziness and weakness    Immunizations Administered    Name Date Dose VIS Date Route   Pfizer COVID-19 Vaccine 12/11/2019 11:35 AM 0.3 mL 10/10/2019 Intramuscular   Manufacturer: [employer]   Lot: [id number] [id number]   [place]: [id number]

## 2019-12-19 ENCOUNTER — Telehealth: Payer: Self-pay | Admitting: Internal Medicine

## 2019-12-19 NOTE — Telephone Encounter (Signed)
Pt received a message from [employer] that his last cologuard was 10/12/16 and that it is time to do it again. He believes that we have to order it to be sent to him. He would like to know if it will be ordered or not?   Pt can be reached at [phone]

## 2019-12-23 NOTE — Telephone Encounter (Signed)
Left message on machine for patient.  Patient will need to schedule a physical and then Cologuard can be ordered

## 2020-03-10 ENCOUNTER — Other Ambulatory Visit: Payer: Self-pay

## 2020-03-11 ENCOUNTER — Encounter: Payer: Self-pay | Admitting: Internal Medicine

## 2020-03-11 ENCOUNTER — Ambulatory Visit (INDEPENDENT_AMBULATORY_CARE_PROVIDER_SITE_OTHER): Payer: Medicare Other | Admitting: Internal Medicine

## 2020-03-11 VITALS — BP 110/78 | HR 64 | Temp 97.4°F | Ht 70.0 in | Wt 202.5 lb

## 2020-03-11 DIAGNOSIS — E785 Hyperlipidemia, unspecified: Secondary | ICD-10-CM

## 2020-03-11 DIAGNOSIS — B356 Tinea cruris: Secondary | ICD-10-CM

## 2020-03-11 DIAGNOSIS — K4090 Unilateral inguinal hernia, without obstruction or gangrene, not specified as recurrent: Secondary | ICD-10-CM | POA: Diagnosis not present

## 2020-03-11 DIAGNOSIS — H6121 Impacted cerumen, right ear: Secondary | ICD-10-CM

## 2020-03-11 DIAGNOSIS — Z125 Encounter for screening for malignant neoplasm of prostate: Secondary | ICD-10-CM

## 2020-03-11 DIAGNOSIS — Z85828 Personal history of other malignant neoplasm of skin: Secondary | ICD-10-CM | POA: Diagnosis not present

## 2020-03-11 DIAGNOSIS — Z0000 Encounter for general adult medical examination without abnormal findings: Secondary | ICD-10-CM

## 2020-03-11 DIAGNOSIS — K219 Gastro-esophageal reflux disease without esophagitis: Secondary | ICD-10-CM | POA: Diagnosis not present

## 2020-03-11 LAB — LIPID PANEL
Cholesterol: 218 mg/dL — ABNORMAL HIGH (ref 0–200)
HDL: 40.6 mg/dL (ref >39.00)
LDL Cholesterol: 144 mg/dL — ABNORMAL HIGH (ref 0–99)
NonHDL: 177.07
Total CHOL/HDL Ratio: 5
Triglycerides: 163 mg/dL — ABNORMAL HIGH (ref 0.0–149.0)
VLDL: 32.6 mg/dL (ref 0.0–40.0)

## 2020-03-11 LAB — CBC WITH DIFFERENTIAL/PLATELET
Basophils Absolute: 0.1 10*3/uL (ref 0.0–0.1)
Basophils Relative: 0.8 % (ref 0.0–3.0)
Eosinophils Absolute: 0.1 10*3/uL (ref 0.0–0.7)
Eosinophils Relative: 1.1 % (ref 0.0–5.0)
HCT: 48.4 % (ref 39.0–52.0)
Hemoglobin: 16.7 g/dL (ref 13.0–17.0)
Lymphocytes Relative: 28.6 % (ref 12.0–46.0)
Lymphs Abs: 1.9 10*3/uL (ref 0.7–4.0)
MCHC: 34.5 g/dL (ref 30.0–36.0)
MCV: 92.4 fl (ref 78.0–100.0)
Monocytes Absolute: 0.6 10*3/uL (ref 0.1–1.0)
Monocytes Relative: 8.6 % (ref 3.0–12.0)
Neutro Abs: 4.1 10*3/uL (ref 1.4–7.7)
Neutrophils Relative %: 60.9 % (ref 43.0–77.0)
Platelets: 186 10*3/uL (ref 150.0–400.0)
RBC: 5.24 Mil/uL (ref 4.22–5.81)
RDW: 13.6 % (ref 11.5–15.5)
WBC: 6.7 10*3/uL (ref 4.0–10.5)

## 2020-03-11 LAB — COMPREHENSIVE METABOLIC PANEL
ALT: 26 U/L (ref 0–53)
AST: 23 U/L (ref 0–37)
Albumin: 4.4 g/dL (ref 3.5–5.2)
Alkaline Phosphatase: 63 U/L (ref 39–117)
BUN: 16 mg/dL (ref 6–23)
CO2: 28 mEq/L (ref 19–32)
Calcium: 9.4 mg/dL (ref 8.4–10.5)
Chloride: 101 mEq/L (ref 96–112)
Creatinine, Ser: 0.95 mg/dL (ref 0.40–1.50)
GFR: 78.57 mL/min (ref >60.00)
Glucose, Bld: 97 mg/dL (ref 70–99)
Potassium: 4.5 mEq/L (ref 3.5–5.1)
Sodium: 136 mEq/L (ref 135–145)
Total Bilirubin: 0.7 mg/dL (ref 0.2–1.2)
Total Protein: 6.8 g/dL (ref 6.0–8.3)

## 2020-03-11 LAB — PSA: PSA: 4.89 ng/mL — ABNORMAL HIGH (ref 0.10–4.00)

## 2020-03-11 MED ORDER — NYSTATIN 100000 UNIT/GM EX POWD: 1.0000 "application " | Freq: Three times a day (TID) | CUTANEOUS | 0 refills | Status: DC

## 2020-03-11 NOTE — Progress Notes (Signed)
Established Patient Office Visit     This visit occurred during the SARS-CoV-2 public health emergency.  Safety protocols were in place, including screening questions prior to the visit, additional usage of staff PPE, and extensive cleaning of exam room while observing appropriate contact time as indicated for disinfecting solutions.    CC/Reason for Visit: Subsequent Medicare wellness visit and follow-up chronic medical conditions  HPI: Andrew Mcmahon is a 69 y.o. male who is coming in today for the above mentioned reasons. Past Medical History is significant for: hyperlipidemia, GERD and prostatitis followed by urology.  He has had 2- biopsies for elevated PSAs that have been negative.  He takes 20 mg of Nexium over-the-counter every other day for GERD.  He had a right inguinal hernia repair 7 years ago.  He has 2 main complaints today: Itching, rash of his left groin area and what he believes to be a left inguinal hernia.  Other than this no issues.  He has received both of his Covid vaccines.  He has routine eye and dental care.  He had a Cologuard 3 years ago.   Past Medical/Surgical History: Past Medical History:  Diagnosis Date  . Back pain    low, hx radiculopathy, saw ortho in the past  . Closed right ankle fracture 09/2015   per pt due to fall while hiking  . Deviated nasal septum   . GERD (gastroesophageal reflux disease)   . History of kidney stones    kidney stones - h/o   . Kidney stones    history  . 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 Panic attack   . PONV (postoperative nausea and vomiting)   . Right inguinal hernia   . Wears glasses     Past Surgical History:  Procedure Laterality Date  . COLONOSCOPY    . INGUINAL HERNIA REPAIR Right 11/29/2016   Procedure: LAPAROSCOPIC RIGHT  INGUINAL HERNIA;  Surgeon: Coralie Keens, MD;  Location: [facility];  Service: General;  Laterality: Right;  . INSERTION OF MESH Right 11/29/2016   Procedure: INSERTION  OF MESH;  Surgeon: Coralie Keens, MD;  Location: [facility];  Service: General;  Laterality: Right;  . MASS EXCISION  08/08/2012   Procedure: MINOR EXCISION OF MASS;  Surgeon: Cammie Sickle., MD;  Location: [facility];  Service: Orthopedics;  Laterality: Right;  Minor Excision of Foreign Body  . MOHS SURGERY    . NASAL SEPTUM SURGERY    . [place] EXTRACTION  1980    Social History:  reports that he has never smoked. He has never used smokeless tobacco. He reports current alcohol use. He reports that he does not use drugs.  Allergies: Allergies  Allergen Reactions  . No Known Allergies     Family History:  Family History  Problem Relation Age of Onset  . Liver disease Other   . Multiple myeloma Father        died at 35      Current Outpatient Medications:  .  ALPRAZolam (XANAX) 0.25 MG tablet, Take 1 tablet (0.25 mg total) by mouth 2 (two) times daily as needed for anxiety. For panic., Disp: 10 tablet, Rfl: 0 .  cholecalciferol (VITAMIN D3) 25 MCG (1000 UNIT) tablet, Take 1,000 Units by mouth daily., Disp: , Rfl:  .  COLLAGEN PO, Take by mouth., Disp: , Rfl:  .  esomeprazole (NEXIUM) 20 MG capsule, Take 20 mg by mouth  every other day., Disp: , Rfl:  .  fish oil-omega-3 fatty acids 1000 MG capsule, Take 1 g by mouth daily. , Disp: , Rfl:  .  Probiotic Product (PROBIOTIC PO), Take 1 capsule by mouth daily. , Disp: , Rfl:  .  nystatin (MYCOSTATIN/NYSTOP) powder, Apply 1 application topically 3 (three) times daily., Disp: 15 g, Rfl: 0  Review of Systems:  Constitutional: Denies fever, chills, diaphoresis, appetite change and fatigue.  HEENT: Denies photophobia, eye pain, redness, hearing loss, ear pain, congestion, sore throat, rhinorrhea, sneezing, mouth sores, trouble swallowing, neck pain, neck stiffness and tinnitus.   Respiratory: Denies SOB, DOE, cough, chest tightness,  and wheezing.   Cardiovascular: Denies chest pain, palpitations and leg swelling.    Gastrointestinal: Denies nausea, vomiting, abdominal pain, diarrhea, constipation, blood in stool and abdominal distention.  Genitourinary: Denies dysuria, urgency, frequency, hematuria, flank pain and difficulty urinating.  Endocrine: Denies: hot or cold intolerance, sweats, changes in hair or nails, polyuria, polydipsia. Musculoskeletal: Denies myalgias, back pain, joint swelling, arthralgias and gait problem.  Skin: Denies pallor, rash and wound.  Neurological: Denies dizziness, seizures, syncope, weakness, light-headedness, numbness and headaches.  Hematological: Denies adenopathy. Easy bruising, personal or family bleeding history  Psychiatric/Behavioral: Denies suicidal ideation, mood changes, confusion, nervousness, sleep disturbance and agitation    Physical Exam: Vitals:   03/11/20 1133  BP: 110/78  Pulse: 64  Temp: (!) 97.4 F (36.3 C)  TempSrc: Temporal  SpO2: 97%  Weight: 202 lb 8 oz (91.9 kg)  Height: '5\' 10"'  (1.778 m)    Body mass index is 29.06 kg/m.   Constitutional: NAD, calm, comfortable Eyes: PERRL, lids and conjunctivae normal, wears corrective lenses ENMT: Mucous membranes are moist. Tympanic membrane is pearly white, no erythema or bulging on the left, right is obstructed by cerumen Neck: normal, supple, no masses, no thyromegaly Respiratory: clear to auscultation bilaterally, no wheezing, no crackles. Normal respiratory effort. No accessory muscle use.  Cardiovascular: Regular rate and rhythm, no murmurs / rubs / gallops. No extremity edema. 2+ pedal pulses. N Abdomen: no tenderness, no masses palpated. No hepatosplenomegaly. Bowel sounds positive.  Musculoskeletal: no clubbing / cyanosis. No joint deformity upper and lower extremities. Good ROM, no contractures. Normal muscle tone.  Skin: no rashes, lesions, ulcers. No induration Neurologic: CN 2-12 grossly intact. Sensation intact, DTR normal. Strength 5/5 in all 4.  Psychiatric: Normal judgment and  insight. Alert and oriented x 3. Normal mood.    Subsequent Medicare wellness visit   1. Risk factors, based on past  M,S,F -cardiovascular disease risk factors include age, gender, history of hyperlipidemia   2.  Physical activities: He is quite physically active and walks 4 to 5 miles a day in addition to weight training   3.  Depression/mood:  Stable, not depressed   4.  Hearing:  No perceived issues   5.  ADL's: Independent in all ADLs   6.  Fall risk:  Low fall risk   7.  Home safety: No problems identified   8.  Height weight, and visual acuity: Height and weight as above, visual acuity is 20/16 with each eye independently and together   9.  Counseling:  Advised shingles vaccination series   10. Lab orders based on risk factors: Laboratory update will be reviewed   11. Referral :  None today   12. Care plan:  Follow-up in 1 year   13. Cognitive assessment:  No cognitive impairment   14. Screening: Patient provided with a written  and personalized 5-10 year screening schedule in the AVS.   yes   15. Provider List Update:   PCP only  16. Advance Directives: Full code     Office Visit from 03/11/2020 in [facility] [place]  PHQ-9 Total Score  0      Fall Risk  03/11/2020 01/29/2018 06/12/2016  Falls in the past year? 0 No Yes  Number falls in past yr: 0 - 1  Injury with Fall? 0 - Yes     Impression and Plan:  Encounter for preventive health examination -He has routine eye and dental care. -Needs shingles vaccination series, otherwise immunizations are up-to-date including Covid x2. -Screening labs today. -Healthy lifestyle discussed in detail. -Check PSA today. -Cologuard will be requested today. -He has annual skin checks with his dermatologist.  Left inguinal hernia  - Plan: Ambulatory referral to General Surgery  Gastroesophageal reflux disease without esophagitis -Well-controlled on PPI therapy.  Hyperlipidemia, unspecified  hyperlipidemia type -Last LDL was 130 in 2015, recheck lipids today.  SKIN CANCER, HX OF -Follows with dermatology on an annual basis.  Jock itch  - Plan: nystatin (MYCOSTATIN/NYSTOP) powder  Impacted cerumen, right ear -Cerumen Desimpaction  After patient consent was obtained, warm water was applied and gentle ear lavage performed on right ear. There were no complications and following the desimpaction the tympanic membranes were visible. Tympanic membranes are intact following the procedure. Auditory canals are normal. The patient reported relief of symptoms after removal of cerumen.    Patient Instructions  -Nice seeing you today!!  -Lab work today; will notify you once results are available.  -Referral to surgery today.  -apply nystatin powder to clean, dry skin in the groin area up to 4 times a day, but at least twice a day.  -Shingles vaccination series at your pharmacy.  -Schedule follow up in 1 year or sooner as needed.   Preventive Care 52 Years and Older, Male Preventive care refers to lifestyle choices and visits with your health care provider that can promote health and wellness. This includes:  A yearly physical exam. This is also called an annual well check.  Regular dental and eye exams.  Immunizations.  Screening for certain conditions.  Healthy lifestyle choices, such as diet and exercise. What can I expect for my preventive care visit? Physical exam Your health care provider will check:  Height and weight. These may be used to calculate body mass index (BMI), which is a measurement that tells if you are at a healthy weight.  Heart rate and blood pressure.  Your skin for abnormal spots. Counseling Your health care provider may ask you questions about:  Alcohol, tobacco, and drug use.  Emotional well-being.  Home and relationship well-being.  Sexual activity.  Eating habits.  History of falls.  Memory and ability to understand  (cognition).  Work and work Statistician. What immunizations do I need?  Influenza (flu) vaccine  This is recommended every year. Tetanus, diphtheria, and pertussis (Tdap) vaccine  You may need a Td booster every 10 years. Varicella (chickenpox) vaccine  You may need this vaccine if you have not already been vaccinated. Zoster (shingles) vaccine  You may need this after age 57. Pneumococcal conjugate (PCV13) vaccine  One dose is recommended after age 56. Pneumococcal polysaccharide (PPSV23) vaccine  One dose is recommended after age 17. Measles, mumps, and rubella (MMR) vaccine  You may need at least one dose of MMR if you were born in 1957 or later. You may also  need a second dose. Meningococcal conjugate (MenACWY) vaccine  You may need this if you have certain conditions. Hepatitis A vaccine  You may need this if you have certain conditions or if you travel or work in places where you may be exposed to hepatitis A. Hepatitis B vaccine  You may need this if you have certain conditions or if you travel or work in places where you may be exposed to hepatitis B. Haemophilus influenzae type b (Hib) vaccine  You may need this if you have certain conditions. You may receive vaccines as individual doses or as more than one vaccine together in one shot (combination vaccines). Talk with your health care provider about the risks and benefits of combination vaccines. What tests do I need? Blood tests  Lipid and cholesterol levels. These may be checked every 5 years, or more frequently depending on your overall health.  Hepatitis C test.  Hepatitis B test. Screening  Lung cancer screening. You may have this screening every year starting at age 76 if you have a 30-pack-year history of smoking and currently smoke or have quit within the past 15 years.  Colorectal cancer screening. All adults should have this screening starting at age 34 and continuing until age 74. Your health  care provider may recommend screening at age 69 if you are at increased risk. You will have tests every 1-10 years, depending on your results and the type of screening test.  Prostate cancer screening. Recommendations will vary depending on your family history and other risks.  Diabetes screening. This is done by checking your blood sugar (glucose) after you have not eaten for a while (fasting). You may have this done every 1-3 years.  Abdominal aortic aneurysm (AAA) screening. You may need this if you are a current or former smoker.  Sexually transmitted disease (STD) testing. Follow these instructions at home: Eating and drinking  Eat a diet that includes fresh fruits and vegetables, whole grains, lean protein, and low-fat dairy products. Limit your intake of foods with high amounts of sugar, saturated fats, and salt.  Take vitamin and mineral supplements as recommended by your health care provider.  Do not drink alcohol if your health care provider tells you not to drink.  If you drink alcohol: ? Limit how much you have to 0-2 drinks a day. ? Be aware of how much alcohol is in your drink. In the [place] one drink equals one 12 oz bottle of beer (355 mL), one 5 oz glass of wine (148 mL), or one 1 oz glass of hard liquor (44 mL). Lifestyle  Take daily care of your teeth and gums.  Stay active. Exercise for at least 30 minutes on 5 or more days each week.  Do not use any products that contain nicotine or tobacco, such as cigarettes, e-cigarettes, and chewing tobacco. If you need help quitting, ask your health care provider.  If you are sexually active, practice safe sex. Use a condom or other form of protection to prevent STIs (sexually transmitted infections).  Talk with your health care provider about taking a low-dose aspirin or statin. What's next?  Visit your health care provider once a year for a well check visit.  Ask your health care provider how often you should have your  eyes and teeth checked.  Stay up to date on all vaccines. This information is not intended to replace advice given to you by your health care provider. Make sure you discuss any questions you have with  your health care provider. Document Revised: 10/10/2018 Document Reviewed: 10/10/2018 Elsevier Patient Education  2020 [place], MD [facility]

## 2020-03-11 NOTE — Patient Instructions (Signed)
-Nice seeing you today!!  -Lab work today; will notify you once results are available.  -Referral to surgery today.  -apply nystatin powder to clean, dry skin in the groin area up to 4 times a day, but at least twice a day.  -Shingles vaccination series at your pharmacy.  -Schedule follow up in 1 year or sooner as needed.   Preventive Care 69 Years and Older, Male Preventive care refers to lifestyle choices and visits with your health care provider that can promote health and wellness. This includes:  A yearly physical exam. This is also called an annual well check.  Regular dental and eye exams.  Immunizations.  Screening for certain conditions.  Healthy lifestyle choices, such as diet and exercise. What can I expect for my preventive care visit? Physical exam Your health care provider will check:  Height and weight. These may be used to calculate body mass index (BMI), which is a measurement that tells if you are at a healthy weight.  Heart rate and blood pressure.  Your skin for abnormal spots. Counseling Your health care provider may ask you questions about:  Alcohol, tobacco, and drug use.  Emotional well-being.  Home and relationship well-being.  Sexual activity.  Eating habits.  History of falls.  Memory and ability to understand (cognition).  Work and work Statistician. What immunizations do I need?  Influenza (flu) vaccine  This is recommended every year. Tetanus, diphtheria, and pertussis (Tdap) vaccine  You may need a Td booster every 10 years. Varicella (chickenpox) vaccine  You may need this vaccine if you have not already been vaccinated. Zoster (shingles) vaccine  You may need this after age 69. Pneumococcal conjugate (PCV13) vaccine  One dose is recommended after age 69. Pneumococcal polysaccharide (PPSV23) vaccine  One dose is recommended after age 69. Measles, mumps, and rubella (MMR) vaccine  You may need at least one dose of  MMR if you were born in 1957 or later. You may also need a second dose. Meningococcal conjugate (MenACWY) vaccine  You may need this if you have certain conditions. Hepatitis A vaccine  You may need this if you have certain conditions or if you travel or work in places where you may be exposed to hepatitis A. Hepatitis B vaccine  You may need this if you have certain conditions or if you travel or work in places where you may be exposed to hepatitis B. Haemophilus influenzae type b (Hib) vaccine  You may need this if you have certain conditions. You may receive vaccines as individual doses or as more than one vaccine together in one shot (combination vaccines). Talk with your health care provider about the risks and benefits of combination vaccines. What tests do I need? Blood tests  Lipid and cholesterol levels. These may be checked every 5 years, or more frequently depending on your overall health.  Hepatitis C test.  Hepatitis B test. Screening  Lung cancer screening. You may have this screening every year starting at age 69 if you have a 30-pack-year history of smoking and currently smoke or have quit within the past 15 years.  Colorectal cancer screening. All adults should have this screening starting at age 69 and continuing until age 69. Your health care provider may recommend screening at age 69 if you are at increased risk. You will have tests every 1-10 years, depending on your results and the type of screening test.  Prostate cancer screening. Recommendations will vary depending on your family history and other  risks.  Diabetes screening. This is done by checking your blood sugar (glucose) after you have not eaten for a while (fasting). You may have this done every 1-3 years.  Abdominal aortic aneurysm (AAA) screening. You may need this if you are a current or former smoker.  Sexually transmitted disease (STD) testing. Follow these instructions at home: Eating and  drinking  Eat a diet that includes fresh fruits and vegetables, whole grains, lean protein, and low-fat dairy products. Limit your intake of foods with high amounts of sugar, saturated fats, and salt.  Take vitamin and mineral supplements as recommended by your health care provider.  Do not drink alcohol if your health care provider tells you not to drink.  If you drink alcohol: ? Limit how much you have to 0-2 drinks a day. ? Be aware of how much alcohol is in your drink. In the [place] one drink equals one 12 oz bottle of beer (355 mL), one 5 oz glass of wine (148 mL), or one 1 oz glass of hard liquor (44 mL). Lifestyle  Take daily care of your teeth and gums.  Stay active. Exercise for at least 30 minutes on 5 or more days each week.  Do not use any products that contain nicotine or tobacco, such as cigarettes, e-cigarettes, and chewing tobacco. If you need help quitting, ask your health care provider.  If you are sexually active, practice safe sex. Use a condom or other form of protection to prevent STIs (sexually transmitted infections).  Talk with your health care provider about taking a low-dose aspirin or statin. What's next?  Visit your health care provider once a year for a well check visit.  Ask your health care provider how often you should have your eyes and teeth checked.  Stay up to date on all vaccines. This information is not intended to replace advice given to you by your health care provider. Make sure you discuss any questions you have with your health care provider. Document Revised: 10/10/2018 Document Reviewed: 10/10/2018 Elsevier Patient Education  2020 [employer].

## 2020-05-26 DIAGNOSIS — Z1211 Encounter for screening for malignant neoplasm of colon: Secondary | ICD-10-CM | POA: Diagnosis not present

## 2020-05-26 DIAGNOSIS — Z1212 Encounter for screening for malignant neoplasm of rectum: Secondary | ICD-10-CM | POA: Diagnosis not present

## 2020-06-03 LAB — COLOGUARD: Cologuard: NEGATIVE

## 2020-06-04 LAB — COLOGUARD: COLOGUARD: NEGATIVE

## 2020-06-04 LAB — EXTERNAL GENERIC LAB PROCEDURE: COLOGUARD: NEGATIVE

## 2020-06-09 ENCOUNTER — Encounter: Payer: Self-pay | Admitting: Internal Medicine

## 2020-07-14 ENCOUNTER — Other Ambulatory Visit: Payer: Self-pay | Admitting: Surgery

## 2020-07-14 DIAGNOSIS — K4090 Unilateral inguinal hernia, without obstruction or gangrene, not specified as recurrent: Secondary | ICD-10-CM | POA: Diagnosis not present

## 2020-07-28 DIAGNOSIS — M79671 Pain in right foot: Secondary | ICD-10-CM | POA: Diagnosis not present

## 2020-07-28 DIAGNOSIS — M67961 Unspecified disorder of synovium and tendon, right lower leg: Secondary | ICD-10-CM | POA: Diagnosis not present

## 2020-08-05 DIAGNOSIS — Z23 Encounter for immunization: Secondary | ICD-10-CM | POA: Diagnosis not present

## 2020-08-09 DIAGNOSIS — M25572 Pain in left ankle and joints of left foot: Secondary | ICD-10-CM | POA: Diagnosis not present

## 2020-08-11 DIAGNOSIS — K4090 Unilateral inguinal hernia, without obstruction or gangrene, not specified as recurrent: Secondary | ICD-10-CM | POA: Diagnosis not present

## 2020-08-12 DIAGNOSIS — L820 Inflamed seborrheic keratosis: Secondary | ICD-10-CM | POA: Diagnosis not present

## 2020-08-12 DIAGNOSIS — L821 Other seborrheic keratosis: Secondary | ICD-10-CM | POA: Diagnosis not present

## 2020-08-12 DIAGNOSIS — D485 Neoplasm of uncertain behavior of skin: Secondary | ICD-10-CM | POA: Diagnosis not present

## 2020-08-12 DIAGNOSIS — Z85828 Personal history of other malignant neoplasm of skin: Secondary | ICD-10-CM | POA: Diagnosis not present

## 2020-08-12 DIAGNOSIS — D1801 Hemangioma of skin and subcutaneous tissue: Secondary | ICD-10-CM | POA: Diagnosis not present

## 2020-08-12 DIAGNOSIS — L812 Freckles: Secondary | ICD-10-CM | POA: Diagnosis not present

## 2020-09-07 ENCOUNTER — Ambulatory Visit: Payer: Self-pay | Admitting: General Surgery

## 2020-09-07 NOTE — H&P (Signed)
  History of Present Illness Ralene Ok MD; 08/11/2020 10:15 AM) The patient is a 69 year old male who presents with an inguinal hernia. Patient is a 69 year old male comes in with a left large internal hernia. Patient a previous TEP repair by Dr. Rush Farmer. Years ago. Patient states that he is very active. He does state that he notices it towards the left inguinal area was been sitting for a period of time. He's had no signs or symptoms of incarceration or strangulation  ---------------------------------- Chief complaint: Left inguinal hernia  This is a pleasant 69 year old gentleman who I performed a laparoscopic right inguinal hernia repair with mesh on several years ago. He has recently developed a left inguinal hernia and is interested in repair. He has almost no discomfort and no objective symptoms. He reports it easily reduces. He has had no issues regarding his right inguinal hernia.   Allergies (Chanel Teressa Senter, CMA; 08/11/2020 9:34 AM) No Known Drug Allergies  [11/03/2016]: Allergies Reconciled   Medication History (Chanel Teressa Senter, CMA; 08/11/2020 9:34 AM) Aspirin (81MG  Tablet, Oral) Active. Fish Oil + D3 (1000-1000MG -UNIT Capsule, Oral) Active. Probiotic Formula (Oral) Active. NexIUM (Oral) Specific strength unknown - Active. Medications Reconciled  Vitals (Chanel Nolan CMA; 08/11/2020 9:34 AM) 08/11/2020 9:34 AM Weight: 206.5 lb Height: 71in Body Surface Area: 2.14 m Body Mass Index: 28.8 kg/m  Temp.: 97.23F  Pulse: 62 (Regular)  BP: 126/74(Sitting, Left Arm, Standard)       Physical Exam Ralene Ok MD; 08/11/2020 10:16 AM) The physical exam findings are as follows: Note: Constitutional: No acute distress, conversant, appears stated age  Eyes: Anicteric sclerae, moist conjunctiva, no lid lag  Neck: No thyromegaly, trachea midline, no cervical lymphadenopathy  Lungs: Clear to auscultation biilaterally, normal respiratory  effot  Cardiovascular: regular rate & rhythm, no murmurs, no peripheal edema, pedal pulses 2+  GI: Soft, no masses or hepatosplenomegaly, non-tender to palpation, large left inguinal hernia  MSK: Normal gait, no clubbing cyanosis, edema  Skin: No rashes, palpation reveals normal skin turgor  Psychiatric: Appropriate judgment and insight, oriented to person, place, and time On exam, he looks well    Assessment & Plan Ralene Ok MD; 08/11/2020 10:17 AM) LEFT INGUINAL HERNIA (K40.90) Impression: 69 year old male with a previous laparoscopic right inguinal hernia repair, now with a left inguinal hernia  1. The patient will like to proceed to the operating room for robotic left inguinal hernia repair with mesh, versus open  2. I discussed with the patient the signs and symptoms of incarceration and strangulation and the need to proceed to the ER should they occur.  3. I discussed with the patient the risks and benefits of the procedure to include but not limited to: Infection, bleeding, damage to surrounding structures, possible need for further surgery, possible nerve pain, and possible recurrence. The patient was understanding and wishes to proceed.

## 2020-09-29 ENCOUNTER — Telehealth (INDEPENDENT_AMBULATORY_CARE_PROVIDER_SITE_OTHER): Payer: Medicare Other | Admitting: Internal Medicine

## 2020-09-29 DIAGNOSIS — R519 Headache, unspecified: Secondary | ICD-10-CM

## 2020-09-29 NOTE — Progress Notes (Signed)
Virtual Visit via Video Note  I connected with Andrew Mcmahon on 09/29/20 at  3:00 PM EST by a video enabled telemedicine application and verified that I am speaking with the correct person using two identifiers.  Location patient: home Location provider: work office Persons participating in the virtual visit: patient, provider  I discussed the limitations of evaluation and management by telemedicine and the availability of in person appointments. The patient expressed understanding and agreed to proceed.   HPI: He has scheduled this visit to discuss the acute onset of a headache.  This headache began about 1 week ago.  It is located over his right temple behind his right eye.  It usually happens in the middle of the night.  It is a throbbing type pain that will last several hours at a time.  Aleve has helped somewhat but not completely.  He denies any fever, vision disturbances, ear pain, jaw pain.  No sick contacts or recent travel.  He has checked his blood pressure recently and it has been within normal limits.  He has had all 3 Covid vaccines, he is also vaccinated against influenza.  He does not have any focal neurologic deficits.  He does not have any dizziness or vertigo.  It has happened pretty much every night since the onset.  He has no daytime symptoms.   ROS: Constitutional: Denies fever, chills, diaphoresis, appetite change and fatigue.  HEENT: Denies photophobia, eye pain, redness, hearing loss, ear pain, congestion, sore throat, rhinorrhea, sneezing, mouth sores, trouble swallowing, neck pain, neck stiffness and tinnitus.   Respiratory: Denies SOB, DOE, cough, chest tightness,  and wheezing.   Cardiovascular: Denies chest pain, palpitations and leg swelling.  Gastrointestinal: Denies nausea, vomiting, abdominal pain, diarrhea, constipation, blood in stool and abdominal distention.  Genitourinary: Denies dysuria, urgency, frequency, hematuria, flank pain and difficulty  urinating.  Endocrine: Denies: hot or cold intolerance, sweats, changes in hair or nails, polyuria, polydipsia. Musculoskeletal: Denies myalgias, back pain, joint swelling, arthralgias and gait problem.  Skin: Denies pallor, rash and wound.  Neurological: Denies dizziness, seizures, syncope, weakness, light-headedness, numbness and headaches.  Hematological: Denies adenopathy. Easy bruising, personal or family bleeding history  Psychiatric/Behavioral: Denies suicidal ideation, mood changes, confusion, nervousness, sleep disturbance and agitation   Past Medical History:  Diagnosis Date  . Back pain    low, hx radiculopathy, saw ortho in the past  . Closed right ankle fracture 09/2015   per pt due to fall while hiking  . Deviated nasal septum   . GERD (gastroesophageal reflux disease)   . History of kidney stones    kidney stones - h/o   . Kidney stones    history  . 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 Panic attack   . PONV (postoperative nausea and vomiting)   . Right inguinal hernia   . Wears glasses     Past Surgical History:  Procedure Laterality Date  . COLONOSCOPY    . INGUINAL HERNIA REPAIR Right 11/29/2016   Procedure: LAPAROSCOPIC RIGHT  INGUINAL HERNIA;  Surgeon: Coralie Keens, MD;  Location: [facility];  Service: General;  Laterality: Right;  . INSERTION OF MESH Right 11/29/2016   Procedure: INSERTION OF MESH;  Surgeon: Coralie Keens, MD;  Location: [facility];  Service: General;  Laterality: Right;  . MASS EXCISION  08/08/2012   Procedure: MINOR EXCISION OF MASS;  Surgeon: Cammie Sickle., MD;  Location: [facility];  Service: Orthopedics;  Laterality: Right;  Minor Excision of Foreign Body  . MOHS SURGERY    . NASAL SEPTUM SURGERY    . WISDOM TOOTH EXTRACTION  1980    Family History  Problem Relation Age of Onset  . Liver disease Other   . Multiple myeloma Father        died at 51     SOCIAL HX:   reports that he has never smoked.  He has never used smokeless tobacco. He reports current alcohol use. He reports that he does not use drugs.   Current Outpatient Medications:  .  ALPRAZolam (XANAX) 0.25 MG tablet, Take 1 tablet (0.25 mg total) by mouth 2 (two) times daily as needed for anxiety. For panic., Disp: 10 tablet, Rfl: 0 .  cholecalciferol (VITAMIN D3) 25 MCG (1000 UNIT) tablet, Take 1,000 Units by mouth daily., Disp: , Rfl:  .  COLLAGEN PO, Take by mouth., Disp: , Rfl:  .  esomeprazole (NEXIUM) 20 MG capsule, Take 20 mg by mouth every other day., Disp: , Rfl:  .  fish oil-omega-3 fatty acids 1000 MG capsule, Take 1 g by mouth daily. , Disp: , Rfl:  .  nystatin (MYCOSTATIN/NYSTOP) powder, Apply 1 application topically 3 (three) times daily., Disp: 15 g, Rfl: 0 .  Probiotic Product (PROBIOTIC PO), Take 1 capsule by mouth daily. , Disp: , Rfl:   EXAM:   VITALS per patient if applicable:   GENERAL: alert, oriented, appears well and in no acute distress  HEENT: atraumatic, conjunttiva clear, no obvious abnormalities on inspection of external nose and ears, wears corrective lenses  NECK: normal movements of the head and neck  LUNGS: on inspection no signs of respiratory distress, breathing rate appears normal, no obvious gross increased work of breathing, gasping or wheezing  CV: no obvious cyanosis  MS: moves all visible extremities without noticeable abnormality  PSYCH/NEURO: pleasant and cooperative, no obvious depression or anxiety, speech and thought processing grossly intact  ASSESSMENT AND PLAN:   Acute nonintractable headache, unspecified headache type  -Although etiology remains unclear to me at this time, he is not giving me any warning symptoms. -In the differential with a temporal headache is of course temporal arteritis, however without fever or vision disturbances this seems unlikely. -Doubt ear infection or trigeminal neuralgia given characteristics of pain. -I wonder about a preshingles  neuralgia.  He has not noticed any rashes around that area but will be on the look out for it. -I do not think brain imaging is necessary at this time. -Have advised OTC NSAIDs and Tylenol as needed. -He will reach out to me if his symptoms worsen or progress, he has also been instructed about red flag signs.   I discussed the assessment and treatment plan with the patient. The patient was provided an opportunity to ask questions and all were answered. The patient agreed with the plan and demonstrated an understanding of the instructions.   The patient was advised to call back or seek an in-person evaluation if the symptoms worsen or if the condition fails to improve as anticipated.    Lelon Frohlich, MD  [facility] at [facility]

## 2020-10-08 ENCOUNTER — Telehealth (INDEPENDENT_AMBULATORY_CARE_PROVIDER_SITE_OTHER): Payer: Medicare Other | Admitting: Internal Medicine

## 2020-10-08 DIAGNOSIS — G44001 Cluster headache syndrome, unspecified, intractable: Secondary | ICD-10-CM | POA: Diagnosis not present

## 2020-10-08 MED ORDER — SUMATRIPTAN 20 MG/ACT NA SOLN: 20.0000 mg | NASAL | 1 refills | Status: DC | PRN

## 2020-10-08 NOTE — Progress Notes (Signed)
Virtual Visit via Video Note  I connected with Andrew Mcmahon on 10/08/20 at  3:30 PM EST by a video enabled telemedicine application and verified that I am speaking with the correct person using two identifiers.  Location patient: home Location provider: work office Persons participating in the virtual visit: patient, provider  I discussed the limitations of evaluation and management by telemedicine and the availability of in person appointments. The patient expressed understanding and agreed to proceed.   HPI: Andrew Mcmahon is seen today as a follow-up to our prior video consultation on December 1.  Unfortunately he continues to have almost nightly occurrences of headaches.  Headaches wake him up from sleep at around 12:30 at night.  They are always right behind his right eye.  They last about 2 to 3 hours.  He describes it as a sharp shooting pain.  He does not have any nausea or vomiting, no visual disturbances.  He has noticed since our last visit appearance of excessive lacrimation of his right eye and sometimes droopiness of his right eye.  At times he feels a runny nose as well.  The symptoms get better once the headache resolves.  He feels very restless while he has the headache.  He is taking Advil without much relief.  He has never been a smoker.   ROS: Constitutional: Denies fever, chills, diaphoresis, appetite change and fatigue.  HEENT: Denies photophobia, redness, hearing loss, ear pain, mouth sores, trouble swallowing, neck pain, neck stiffness and tinnitus.   Respiratory: Denies SOB, DOE, cough, chest tightness,  and wheezing.   Cardiovascular: Denies chest pain, palpitations and leg swelling.  Gastrointestinal: Denies nausea, vomiting, abdominal pain, diarrhea, constipation, blood in stool and abdominal distention.  Genitourinary: Denies dysuria, urgency, frequency, hematuria, flank pain and difficulty urinating.  Endocrine: Denies: hot or cold intolerance, sweats, changes in  hair or nails, polyuria, polydipsia. Musculoskeletal: Denies myalgias, back pain, joint swelling, arthralgias and gait problem.  Skin: Denies pallor, rash and wound.  Neurological: Denies dizziness, seizures, syncope, weakness, light-headedness, numbness. Hematological: Denies adenopathy. Easy bruising, personal or family bleeding history  Psychiatric/Behavioral: Denies suicidal ideation, mood changes, confusion, nervousness, sleep disturbance and agitation   Past Medical History:  Diagnosis Date  . Back pain    low, hx radiculopathy, saw ortho in the past  . Closed right ankle fracture 09/2015   per pt due to fall while hiking  . Deviated nasal septum   . GERD (gastroesophageal reflux disease)   . History of kidney stones    kidney stones - h/o   . Kidney stones    history  . 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 Panic attack   . PONV (postoperative nausea and vomiting)   . Right inguinal hernia   . Wears glasses     Past Surgical History:  Procedure Laterality Date  . COLONOSCOPY    . INGUINAL HERNIA REPAIR Right 11/29/2016   Procedure: LAPAROSCOPIC RIGHT  INGUINAL HERNIA;  Surgeon: Coralie Keens, MD;  Location: [facility];  Service: General;  Laterality: Right;  . INSERTION OF MESH Right 11/29/2016   Procedure: INSERTION OF MESH;  Surgeon: Coralie Keens, MD;  Location: [place];  Service: General;  Laterality: Right;  . MASS EXCISION  08/08/2012   Procedure: MINOR EXCISION OF MASS;  Surgeon: Cammie Sickle., MD;  Location: [facility];  Service: Orthopedics;  Laterality: Right;  Minor Excision of Foreign Body  . MOHS SURGERY    .  NASAL SEPTUM SURGERY    . WISDOM TOOTH EXTRACTION  1980    Family History  Problem Relation Age of Onset  . Liver disease Other   . Multiple myeloma Father        died at 19     SOCIAL HX:   reports that he has never smoked. He has never used smokeless tobacco. He reports current alcohol use. He reports that he  does not use drugs.   Current Outpatient Medications:  .  ALPRAZolam (XANAX) 0.25 MG tablet, Take 1 tablet (0.25 mg total) by mouth 2 (two) times daily as needed for anxiety. For panic., Disp: 10 tablet, Rfl: 0 .  cholecalciferol (VITAMIN D3) 25 MCG (1000 UNIT) tablet, Take 1,000 Units by mouth daily., Disp: , Rfl:  .  COLLAGEN PO, Take by mouth., Disp: , Rfl:  .  esomeprazole (NEXIUM) 20 MG capsule, Take 20 mg by mouth every other day., Disp: , Rfl:  .  fish oil-omega-3 fatty acids 1000 MG capsule, Take 1 g by mouth daily., Disp: , Rfl:  .  Probiotic Product (PROBIOTIC PO), Take 1 capsule by mouth daily. , Disp: , Rfl:  .  SUMAtriptan (IMITREX) 20 MG/ACT nasal spray, Place 1 spray (20 mg total) into the nose every 2 (two) hours as needed for migraine or headache. May repeat in 2 hours if headache persists or recurs., Disp: 1 each, Rfl: 1  EXAM:   VITALS per patient if applicable: None reported  GENERAL: alert, oriented, appears well and in no acute distress  HEENT: atraumatic, conjunttiva clear, no obvious abnormalities on inspection of external nose and ears  NECK: normal movements of the head and neck  LUNGS: on inspection no signs of respiratory distress, breathing rate appears normal, no obvious gross increased work of breathing, gasping or wheezing  CV: no obvious cyanosis  MS: moves all visible extremities without noticeable abnormality  PSYCH/NEURO: pleasant and cooperative, no obvious depression or anxiety, speech and thought processing grossly intact  ASSESSMENT AND PLAN:   Intractable cluster headache syndrome, unspecified chronicity pattern -Based on description of headache, suspect these are cluster headaches due to the addition of autonomic symptoms such as excessive lacrimation, ptosis and rhinorrhea. -I will prescribe intranasal sumatriptan for him to try. -I will order an MRI with contrast to make sure there is no space-occupying lesion. -If his headaches fail to  improve, I will refer him to neurology for further evaluation and management.    I discussed the assessment and treatment plan with the patient. The patient was provided an opportunity to ask questions and all were answered. The patient agreed with the plan and demonstrated an understanding of the instructions.   The patient was advised to call back or seek an in-person evaluation if the symptoms worsen or if the condition fails to improve as anticipated.    Lelon Frohlich, MD  [facility] at [facility]

## 2020-10-18 DIAGNOSIS — L820 Inflamed seborrheic keratosis: Secondary | ICD-10-CM | POA: Diagnosis not present

## 2020-10-18 DIAGNOSIS — Z85828 Personal history of other malignant neoplasm of skin: Secondary | ICD-10-CM | POA: Diagnosis not present

## 2020-10-18 DIAGNOSIS — D485 Neoplasm of uncertain behavior of skin: Secondary | ICD-10-CM | POA: Diagnosis not present

## 2020-11-05 ENCOUNTER — Other Ambulatory Visit (HOSPITAL_COMMUNITY): Payer: Medicare Other

## 2020-11-06 ENCOUNTER — Other Ambulatory Visit (HOSPITAL_COMMUNITY)
Admission: RE | Admit: 2020-11-06 | Discharge: 2020-11-06 | Disposition: A | Discharge status: Home / Self Care | Payer: Medicare Other | Source: Ambulatory Visit | Attending: General Surgery | Admitting: General Surgery

## 2020-11-06 DIAGNOSIS — Z20822 Contact with and (suspected) exposure to covid-19: Secondary | ICD-10-CM | POA: Diagnosis not present

## 2020-11-06 DIAGNOSIS — Z01812 Encounter for preprocedural laboratory examination: Secondary | ICD-10-CM | POA: Insufficient documentation

## 2020-11-07 ENCOUNTER — Ambulatory Visit
Admission: RE | Admit: 2020-11-07 | Discharge: 2020-11-07 | Disposition: A | Discharge status: Home / Self Care | Payer: Medicare Other | Source: Ambulatory Visit | Attending: Internal Medicine | Admitting: Internal Medicine

## 2020-11-07 ENCOUNTER — Other Ambulatory Visit: Payer: Self-pay

## 2020-11-07 DIAGNOSIS — G44009 Cluster headache syndrome, unspecified, not intractable: Secondary | ICD-10-CM | POA: Diagnosis not present

## 2020-11-07 DIAGNOSIS — Q279 Congenital malformation of peripheral vascular system, unspecified: Secondary | ICD-10-CM | POA: Diagnosis not present

## 2020-11-07 DIAGNOSIS — J3489 Other specified disorders of nose and nasal sinuses: Secondary | ICD-10-CM | POA: Diagnosis not present

## 2020-11-07 DIAGNOSIS — G44001 Cluster headache syndrome, unspecified, intractable: Secondary | ICD-10-CM

## 2020-11-07 DIAGNOSIS — Q283 Other malformations of cerebral vessels: Secondary | ICD-10-CM | POA: Diagnosis not present

## 2020-11-07 LAB — SARS CORONAVIRUS 2 (TAT 6-24 HRS): SARS Coronavirus 2: NEGATIVE

## 2020-11-07 MED ORDER — GADOBENATE DIMEGLUMINE 529 MG/ML IV SOLN
18.0000 mL | Freq: Once | INTRAVENOUS | Status: AC | PRN
  Administered 2020-11-07: 18 mL via INTRAVENOUS

## 2020-11-08 ENCOUNTER — Encounter (HOSPITAL_COMMUNITY): Payer: Self-pay | Admitting: General Surgery

## 2020-11-08 NOTE — Progress Notes (Signed)
Andrew Mcmahon denies chest pain or shortness of breath. Patient  tested negative on 11/06/20 for Covid and has been in quarantine since that time.

## 2020-11-09 ENCOUNTER — Ambulatory Visit (HOSPITAL_COMMUNITY)
Admission: RE | Admit: 2020-11-09 | Discharge: 2020-11-09 | Disposition: A | Discharge status: Home / Self Care | Payer: Medicare Other | Attending: General Surgery | Admitting: General Surgery

## 2020-11-09 ENCOUNTER — Ambulatory Visit (HOSPITAL_COMMUNITY): Payer: Medicare Other | Admitting: Certified Registered Nurse Anesthetist

## 2020-11-09 ENCOUNTER — Encounter (HOSPITAL_COMMUNITY): Payer: Self-pay | Admitting: General Surgery

## 2020-11-09 ENCOUNTER — Encounter (HOSPITAL_COMMUNITY)
Admission: RE | Disposition: A | Discharge status: Home / Self Care | Payer: Self-pay | Source: Home / Self Care | Attending: General Surgery

## 2020-11-09 ENCOUNTER — Other Ambulatory Visit: Payer: Self-pay

## 2020-11-09 DIAGNOSIS — Z7982 Long term (current) use of aspirin: Secondary | ICD-10-CM | POA: Insufficient documentation

## 2020-11-09 DIAGNOSIS — K4090 Unilateral inguinal hernia, without obstruction or gangrene, not specified as recurrent: Secondary | ICD-10-CM | POA: Diagnosis not present

## 2020-11-09 DIAGNOSIS — E785 Hyperlipidemia, unspecified: Secondary | ICD-10-CM | POA: Diagnosis not present

## 2020-11-09 DIAGNOSIS — K219 Gastro-esophageal reflux disease without esophagitis: Secondary | ICD-10-CM | POA: Diagnosis not present

## 2020-11-09 HISTORY — PX: XI ROBOTIC ASSISTED INGUINAL HERNIA REPAIR WITH MESH: SHX6706

## 2020-11-09 LAB — CBC
HCT: 48.5 % (ref 39.0–52.0)
Hemoglobin: 16.6 g/dL (ref 13.0–17.0)
MCH: 31.3 pg (ref 26.0–34.0)
MCHC: 34.2 g/dL (ref 30.0–36.0)
MCV: 91.5 fL (ref 80.0–100.0)
Platelets: 182 10*3/uL (ref 150–400)
RBC: 5.3 MIL/uL (ref 4.22–5.81)
RDW: 12.6 % (ref 11.5–15.5)
WBC: 5.3 10*3/uL (ref 4.0–10.5)
nRBC: 0 % (ref 0.0–0.2)

## 2020-11-09 SURGERY — REPAIR, HERNIA, INGUINAL, ROBOT-ASSISTED, LAPAROSCOPIC, USING MESH
Anesthesia: General | Site: Abdomen | Laterality: Left

## 2020-11-09 MED ORDER — OXYCODONE HCL 5 MG PO TABS
5.0000 mg | ORAL_TABLET | Freq: Once | ORAL | Status: DC | PRN
Start: 2020-11-09 — End: 2020-11-09

## 2020-11-09 MED ORDER — SODIUM CHLORIDE 0.9 % IV SOLN
INTRAVENOUS | Status: DC | PRN
  Administered 2020-11-09: 40 mL

## 2020-11-09 MED ORDER — DEXAMETHASONE SODIUM PHOSPHATE 10 MG/ML IJ SOLN
INTRAMUSCULAR | Status: DC | PRN
  Administered 2020-11-09: 5 mg via INTRAVENOUS

## 2020-11-09 MED ORDER — PROMETHAZINE HCL 25 MG/ML IJ SOLN: 6.2500 mg | INTRAMUSCULAR | Status: DC | PRN

## 2020-11-09 MED ORDER — ROCURONIUM BROMIDE 10 MG/ML (PF) SYRINGE
PREFILLED_SYRINGE | INTRAVENOUS | Status: DC | PRN
  Administered 2020-11-09: 70 mg via INTRAVENOUS

## 2020-11-09 MED ORDER — CHLORHEXIDINE GLUCONATE CLOTH 2 % EX PADS: 6.0000 | MEDICATED_PAD | Freq: Once | CUTANEOUS | Status: DC

## 2020-11-09 MED ORDER — FENTANYL CITRATE (PF) 250 MCG/5ML IJ SOLN
INTRAMUSCULAR | Status: AC
  Filled 2020-11-09: qty 5

## 2020-11-09 MED ORDER — PROPOFOL 10 MG/ML IV BOLUS
INTRAVENOUS | Status: AC
  Filled 2020-11-09: qty 20

## 2020-11-09 MED ORDER — ONDANSETRON HCL 4 MG/2ML IJ SOLN
INTRAMUSCULAR | Status: AC
  Filled 2020-11-09: qty 4

## 2020-11-09 MED ORDER — MIDAZOLAM HCL 2 MG/2ML IJ SOLN
INTRAMUSCULAR | Status: AC
  Filled 2020-11-09: qty 2

## 2020-11-09 MED ORDER — MEPERIDINE HCL 25 MG/ML IJ SOLN: 6.2500 mg | INTRAMUSCULAR | Status: DC | PRN

## 2020-11-09 MED ORDER — SCOPOLAMINE 1 MG/3DAYS TD PT72
1.0000 | MEDICATED_PATCH | TRANSDERMAL | Status: DC
  Administered 2020-11-09: 1.5 mg via TRANSDERMAL
  Filled 2020-11-09: qty 1

## 2020-11-09 MED ORDER — LACTATED RINGERS IV SOLN: INTRAVENOUS | Status: DC

## 2020-11-09 MED ORDER — ENSURE PRE-SURGERY PO LIQD: 296.0000 mL | Freq: Once | ORAL | Status: DC

## 2020-11-09 MED ORDER — BUPIVACAINE HCL 0.25 % IJ SOLN
INTRAMUSCULAR | Status: DC | PRN
  Administered 2020-11-09: 10 mL

## 2020-11-09 MED ORDER — CHLORHEXIDINE GLUCONATE 0.12 % MT SOLN
15.0000 mL | Freq: Once | OROMUCOSAL | Status: AC
  Administered 2020-11-09: 15 mL via OROMUCOSAL
  Filled 2020-11-09: qty 15

## 2020-11-09 MED ORDER — ROCURONIUM BROMIDE 10 MG/ML (PF) SYRINGE
PREFILLED_SYRINGE | INTRAVENOUS | Status: AC
  Filled 2020-11-09: qty 20

## 2020-11-09 MED ORDER — TRAMADOL HCL 50 MG PO TABS
50.0000 mg | ORAL_TABLET | Freq: Four times a day (QID) | ORAL | 0 refills | Status: DC | PRN
Start: 2020-11-09 — End: 2021-03-15

## 2020-11-09 MED ORDER — FENTANYL CITRATE (PF) 250 MCG/5ML IJ SOLN
INTRAMUSCULAR | Status: DC | PRN
  Administered 2020-11-09: 50 ug via INTRAVENOUS
  Administered 2020-11-09: 150 ug via INTRAVENOUS

## 2020-11-09 MED ORDER — OXYCODONE HCL 5 MG/5ML PO SOLN
5.0000 mg | Freq: Once | ORAL | Status: DC | PRN
Start: 2020-11-09 — End: 2020-11-09

## 2020-11-09 MED ORDER — LIDOCAINE 2% (20 MG/ML) 5 ML SYRINGE
INTRAMUSCULAR | Status: DC | PRN
  Administered 2020-11-09: 40 mg via INTRAVENOUS

## 2020-11-09 MED ORDER — BUPIVACAINE HCL (PF) 0.25 % IJ SOLN
INTRAMUSCULAR | Status: AC
  Filled 2020-11-09: qty 30

## 2020-11-09 MED ORDER — PROPOFOL 10 MG/ML IV BOLUS
INTRAVENOUS | Status: DC | PRN
  Administered 2020-11-09: 150 mg via INTRAVENOUS
  Administered 2020-11-09: 50 mg via INTRAVENOUS

## 2020-11-09 MED ORDER — MIDAZOLAM HCL 5 MG/5ML IJ SOLN
INTRAMUSCULAR | Status: DC | PRN
  Administered 2020-11-09: 2 mg via INTRAVENOUS

## 2020-11-09 MED ORDER — MIDAZOLAM HCL 2 MG/2ML IJ SOLN: 0.5000 mg | Freq: Once | INTRAMUSCULAR | Status: DC | PRN

## 2020-11-09 MED ORDER — CEFAZOLIN SODIUM-DEXTROSE 2-4 GM/100ML-% IV SOLN
2.0000 g | INTRAVENOUS | Status: AC
  Administered 2020-11-09: 2 g via INTRAVENOUS
  Filled 2020-11-09: qty 100

## 2020-11-09 MED ORDER — STERILE WATER FOR IRRIGATION IR SOLN
Status: DC | PRN
  Administered 2020-11-09: 1000 mL

## 2020-11-09 MED ORDER — DEXAMETHASONE SODIUM PHOSPHATE 10 MG/ML IJ SOLN
INTRAMUSCULAR | Status: AC
  Filled 2020-11-09: qty 2

## 2020-11-09 MED ORDER — ACETAMINOPHEN 500 MG PO TABS
1000.0000 mg | ORAL_TABLET | ORAL | Status: AC
  Administered 2020-11-09: 1000 mg via ORAL
  Filled 2020-11-09: qty 2

## 2020-11-09 MED ORDER — ONDANSETRON HCL 4 MG/2ML IJ SOLN
INTRAMUSCULAR | Status: DC | PRN
  Administered 2020-11-09: 4 mg via INTRAVENOUS

## 2020-11-09 MED ORDER — HYDROMORPHONE HCL 1 MG/ML IJ SOLN: 0.2500 mg | INTRAMUSCULAR | Status: DC | PRN

## 2020-11-09 MED ORDER — BUPIVACAINE LIPOSOME 1.3 % IJ SUSP
20.0000 mL | Freq: Once | INTRAMUSCULAR | Status: DC
  Filled 2020-11-09: qty 20

## 2020-11-09 MED ORDER — LIDOCAINE 2% (20 MG/ML) 5 ML SYRINGE
INTRAMUSCULAR | Status: AC
  Filled 2020-11-09: qty 10

## 2020-11-09 MED ORDER — SUGAMMADEX SODIUM 200 MG/2ML IV SOLN
INTRAVENOUS | Status: DC | PRN
  Administered 2020-11-09: 160 mg via INTRAVENOUS

## 2020-11-09 SURGICAL SUPPLY — 56 items
ADH SKN CLS APL DERMABOND .7 (GAUZE/BANDAGES/DRESSINGS) ×1
APL PRP STRL LF DISP 70% ISPRP (MISCELLANEOUS) ×1
CHLORAPREP W/TINT 26 (MISCELLANEOUS) ×2 IMPLANT
COVER MAYO STAND STRL (DRAPES) ×2 IMPLANT
COVER SURGICAL LIGHT HANDLE (MISCELLANEOUS) ×2 IMPLANT
COVER TIP SHEARS 8 DVNC (MISCELLANEOUS) ×1 IMPLANT
COVER TIP SHEARS 8MM DA VINCI (MISCELLANEOUS) ×2
COVER WAND RF STERILE (DRAPES) IMPLANT
DECANTER SPIKE VIAL GLASS SM (MISCELLANEOUS) ×2 IMPLANT
DEFOGGER SCOPE WARMER CLEARIFY (MISCELLANEOUS) ×2 IMPLANT
DERMABOND ADVANCED (GAUZE/BANDAGES/DRESSINGS) ×1
DERMABOND ADVANCED .7 DNX12 (GAUZE/BANDAGES/DRESSINGS) ×1 IMPLANT
DEVICE TROCAR PUNCTURE CLOSURE (ENDOMECHANICALS) ×2 IMPLANT
DRAPE ARM DVNC X/XI (DISPOSABLE) ×4 IMPLANT
DRAPE COLUMN DVNC XI (DISPOSABLE) ×1 IMPLANT
DRAPE CV SPLIT W-CLR ANES SCRN (DRAPES) ×2 IMPLANT
DRAPE DA VINCI XI ARM (DISPOSABLE) ×8
DRAPE DA VINCI XI COLUMN (DISPOSABLE) ×2
DRAPE ORTHO SPLIT 77X108 STRL (DRAPES) ×2
DRAPE SURG ORHT 6 SPLT 77X108 (DRAPES) ×1 IMPLANT
ELECT REM PT RETURN 9FT ADLT (ELECTROSURGICAL) ×2
ELECTRODE REM PT RTRN 9FT ADLT (ELECTROSURGICAL) ×1 IMPLANT
GLOVE BIO SURGEON STRL SZ7.5 (GLOVE) ×4 IMPLANT
GOWN STRL REUS W/ TWL LRG LVL3 (GOWN DISPOSABLE) ×2 IMPLANT
GOWN STRL REUS W/ TWL XL LVL3 (GOWN DISPOSABLE) ×2 IMPLANT
GOWN STRL REUS W/TWL 2XL LVL3 (GOWN DISPOSABLE) ×2 IMPLANT
GOWN STRL REUS W/TWL LRG LVL3 (GOWN DISPOSABLE) ×4
GOWN STRL REUS W/TWL XL LVL3 (GOWN DISPOSABLE) ×4
KIT BASIN OR (CUSTOM PROCEDURE TRAY) ×2 IMPLANT
KIT TURNOVER KIT B (KITS) ×2 IMPLANT
MARKER SKIN DUAL TIP RULER LAB (MISCELLANEOUS) ×2 IMPLANT
MESH PROGRIP LAP SELF FIXATING (Mesh General) ×2 IMPLANT
MESH PROGRIP LAP SLF FIX 16X12 (Mesh General) ×1 IMPLANT
NEEDLE HYPO 22GX1.5 SAFETY (NEEDLE) ×4 IMPLANT
NEEDLE INSUFFLATION 14GA 120MM (NEEDLE) ×2 IMPLANT
OBTURATOR OPTICAL STANDARD 8MM (TROCAR)
OBTURATOR OPTICAL STND 8 DVNC (TROCAR)
OBTURATOR OPTICALSTD 8 DVNC (TROCAR) IMPLANT
PAD ARMBOARD 7.5X6 YLW CONV (MISCELLANEOUS) ×4 IMPLANT
SEAL CANN UNIV 5-8 DVNC XI (MISCELLANEOUS) ×2 IMPLANT
SEAL XI 5MM-8MM UNIVERSAL (MISCELLANEOUS) ×4
SET IRRIG TUBING LAPAROSCOPIC (IRRIGATION / IRRIGATOR) IMPLANT
SET TUBE SMOKE EVAC HIGH FLOW (TUBING) ×2 IMPLANT
STOPCOCK 4 WAY LG BORE MALE ST (IV SETS) ×2 IMPLANT
SUT MNCRL AB 4-0 PS2 18 (SUTURE) ×2 IMPLANT
SUT VIC AB 2-0 SH 27 (SUTURE) ×2
SUT VIC AB 2-0 SH 27X BRD (SUTURE) ×1 IMPLANT
SUT VICRYL 0 AB UR-6 (SUTURE) ×2 IMPLANT
SUT VLOC 180 2-0 6IN GS21 (SUTURE) IMPLANT
SUT VLOC 180 2-0 9IN GS21 (SUTURE) ×2 IMPLANT
SYR 30ML SLIP (SYRINGE) ×2 IMPLANT
SYR TOOMEY 50ML (SYRINGE) ×2 IMPLANT
TOWEL GREEN STERILE FF (TOWEL DISPOSABLE) ×2 IMPLANT
TRAY FOLEY MTR SLVR 14FR STAT (SET/KITS/TRAYS/PACK) IMPLANT
TRAY FOLEY MTR SLVR 16FR STAT (SET/KITS/TRAYS/PACK) ×2 IMPLANT
TRAY LAPAROSCOPIC MC (CUSTOM PROCEDURE TRAY) ×2 IMPLANT

## 2020-11-09 NOTE — Progress Notes (Signed)
Pt stable and out of phase 1, pt's wife was called and discharge paperwork was reviewed with her. Pt waiting for ride (~15 min away) for transport, does not require any monitoring.

## 2020-11-09 NOTE — Anesthesia Postprocedure Evaluation (Signed)
Anesthesia Post Note  Patient: Andrew Mcmahon  Procedure(s) Performed: ROBOTIC LEFT INGUINAL HERNIA WITH MESH (Left Abdomen)     Patient location during evaluation: PACU Anesthesia Type: General Level of consciousness: awake and alert, patient cooperative and oriented Pain management: pain level controlled Vital Signs Assessment: post-procedure vital signs reviewed and stable Respiratory status: spontaneous breathing, nonlabored ventilation and respiratory function stable Cardiovascular status: blood pressure returned to baseline and stable Postop Assessment: no apparent nausea or vomiting Anesthetic complications: no   No complications documented.  Last Vitals:  Vitals:   11/09/20 1151 11/09/20 1206  BP: (!) 171/96 (!) 162/98  Pulse: 66 68  Resp: 14 17  Temp:  (!) 36.2 C  SpO2: 96% 97%    Last Pain:  Vitals:   11/09/20 1206  TempSrc:   PainSc: 2                  Babette Stum,E. Dorlene Footman

## 2020-11-09 NOTE — Anesthesia Procedure Notes (Addendum)
Procedure Name: Intubation Date/Time: 11/09/2020 10:26 AM Performed by: Glynda Jaeger, CRNA Pre-anesthesia Checklist: Patient identified, Patient being monitored, Timeout performed, Emergency Drugs available and Suction available Patient Re-evaluated:Patient Re-evaluated prior to induction Oxygen Delivery Method: Circle System Utilized Preoxygenation: Pre-oxygenation with 100% oxygen Induction Type: IV induction Ventilation: Mask ventilation without difficulty Laryngoscope Size: Mac and 4 Grade View: Grade II Tube type: Oral Tube size: 7.5 mm Number of attempts: 1 Airway Equipment and Method: Stylet Placement Confirmation: ETT inserted through vocal cords under direct vision,  positive ETCO2 and breath sounds checked- equal and bilateral Secured at: 21 cm Tube secured with: Tape Dental Injury: Teeth and Oropharynx as per pre-operative assessment  Comments: Intubated by Jenita Seashore MD

## 2020-11-09 NOTE — Discharge Instructions (Signed)
[facility] _______[facility] °INGUINAL HERNIA REPAIR: POST OP INSTRUCTIONS ° °Always review your discharge instruction sheet given to you by the facility where your surgery was performed. °IF YOU HAVE DISABILITY OR FAMILY LEAVE FORMS, YOU MUST BRING THEM TO THE OFFICE FOR PROCESSING.   °DO NOT GIVE THEM TO YOUR DOCTOR. ° °1. A  prescription for pain medication may be given to you upon discharge.  Take your pain medication as prescribed, if needed.  If narcotic pain medicine is not needed, then you may take acetaminophen (Tylenol) or ibuprofen (Advil) as needed. °2. Take your usually prescribed medications unless otherwise directed. °If you need a refill on your pain medication, please contact your pharmacy.  They will contact our office to request authorization. Prescriptions will not be filled after 5 pm or on week-ends. °3. You should follow a light diet the first 24 hours after arrival home, such as soup and crackers, etc.  Be sure to include lots of fluids daily.  Resume your normal diet the day after surgery. °4.Most patients will experience some swelling and bruising around the umbilicus or in the groin and scrotum.  Ice packs and reclining will help.  Swelling and bruising can take several days to resolve.  °6. It is common to experience some constipation if taking pain medication after surgery.  Increasing fluid intake and taking a stool softener (such as Colace) will usually help or prevent this problem from occurring.  A mild laxative (Milk of Magnesia or Miralax) should be taken according to package directions if there are no bowel movements after 48 hours. °7. Unless discharge instructions indicate otherwise, you may remove your bandages 24-48 hours after surgery, and you may shower at that time.  You may have steri-strips (small skin tapes) in place directly over the incision.  These strips should be left on the skin for 7-10 days.  If your surgeon used skin glue on the incision, you may  shower in 24 hours.  The glue will flake off over the next 2-3 weeks.  Any sutures or staples will be removed at the office during your follow-up visit. °8. ACTIVITIES:  You may resume regular (light) daily activities beginning the next day--such as daily self-care, walking, climbing stairs--gradually increasing activities as tolerated.  You may have sexual intercourse when it is comfortable.  Refrain from any heavy lifting or straining until approved by your doctor. ° °a.You may drive when you are no longer taking prescription pain medication, you can comfortably wear a seatbelt, and you can safely maneuver your car and apply brakes. °b.RETURN TO WORK:   °_____________________________________________ ° °9.You should see your doctor in the office for a follow-up appointment approximately 2-3 weeks after your surgery.  Make sure that you call for this appointment within a day or two after you arrive home to insure a convenient appointment time. °10.OTHER INSTRUCTIONS: _________________________ °   _____________________________________ ° °WHEN TO CALL YOUR DOCTOR: °1. Fever over 101.0 °2. Inability to urinate °3. Nausea and/or vomiting °4. Extreme swelling or bruising °5. Continued bleeding from incision. °6. Increased pain, redness, or drainage from the incision ° °The clinic staff is available to answer your questions during regular business hours.  Please don’t hesitate to call and ask to speak to one of the nurses for clinical concerns.  If you have a medical emergency, go to the nearest emergency room or call 911.  A surgeon from [facility] is always on call at the hospital ° ° °[address], [address], [place], [place]  [address] ? ° [address] [address], [place], [place]   [address] °[phone] ? [phone] ? FAX [phone] °Web site: [email or web address] ° °

## 2020-11-09 NOTE — Anesthesia Preprocedure Evaluation (Addendum)
Anesthesia Evaluation  Patient identified by MRN, date of birth, ID band Patient awake    Reviewed: Allergy & Precautions, NPO status , Patient's Chart, lab work & pertinent test results  History of Anesthesia Complications (+) PONV  Airway Mallampati: II  TM Distance: >3 FB Neck ROM: Full    Dental  (+) Dental Advisory Given, Teeth Intact   Pulmonary neg pulmonary ROS,  11/06/2020 SARS coronavirus NEG   breath sounds clear to auscultation       Cardiovascular negative cardio ROS   Rhythm:Regular Rate:Normal     Neuro/Psych negative neurological ROS     GI/Hepatic Neg liver ROS, GERD  Medicated and Controlled,  Endo/Other  negative endocrine ROS  Renal/GU negative Renal ROS     Musculoskeletal   Abdominal   Peds  Hematology negative hematology ROS (+)   Anesthesia Other Findings   Reproductive/Obstetrics                            Anesthesia Physical Anesthesia Plan  ASA: II  Anesthesia Plan: General   Post-op Pain Management:    Induction: Intravenous  PONV Risk Score and Plan: 3 and Ondansetron, Dexamethasone and Scopolamine patch - Pre-op  Airway Management Planned: Oral ETT  Additional Equipment: None  Intra-op Plan:   Post-operative Plan: Extubation in OR  Informed Consent: I have reviewed the patients History and Physical, chart, labs and discussed the procedure including the risks, benefits and alternatives for the proposed anesthesia with the patient or authorized representative who has indicated his/her understanding and acceptance.     Dental advisory given  Plan Discussed with: CRNA and Surgeon  Anesthesia Plan Comments:        Anesthesia Quick Evaluation

## 2020-11-09 NOTE — Transfer of Care (Signed)
Immediate Anesthesia Transfer of Care Note  Patient: Andrew Mcmahon  Procedure(s) Performed: ROBOTIC LEFT INGUINAL HERNIA WITH MESH (Left Abdomen)  Patient Location: PACU  Anesthesia Type:General  Level of Consciousness: awake, alert , patient cooperative and responds to stimulation  Airway & Oxygen Therapy: Patient Spontanous Breathing  Post-op Assessment: Report given to RN and Post -op Vital signs reviewed and stable  Post vital signs: Reviewed and stable  Last Vitals:  Vitals Value Taken Time  BP 165/89 11/09/20 1136  Temp    Pulse 75 11/09/20 1138  Resp 12 11/09/20 1138  SpO2 99 % 11/09/20 1138  Vitals shown include unvalidated device data.  Last Pain:  Vitals:   11/09/20 0832  TempSrc:   PainSc: 0-No pain      Patients Stated Pain Goal: 3 (70/[id number]/95 [id number])  Complications: No complications documented.

## 2020-11-09 NOTE — H&P (Signed)
  History of Present Illness  The patient is a 70 year old male who presents with an inguinal hernia. Patient is a 70 year old male comes in with a left large internal hernia. Patient a previous TEP repair by Dr. Rush Farmer. Years ago. Patient states that he is very active. He does state that he notices it towards the left inguinal area was been sitting for a period of time. He's had no signs or symptoms of incarceration or strangulation  ---------------------------------- Chief complaint: Left inguinal hernia  This is a pleasant 70 year old gentleman who I performed a laparoscopic right inguinal hernia repair with mesh on several years ago. He has recently developed a left inguinal hernia and is interested in repair. He has almost no discomfort and no objective symptoms. He reports it easily reduces. He has had no issues regarding his right inguinal hernia.   Allergies  No Known Drug Allergies  [11/03/2016]: Allergies Reconciled   Medication History  Aspirin (81MG  Tablet, Oral) Active. Fish Oil + D3 (1000-1000MG -UNIT Capsule, Oral) Active. Probiotic Formula (Oral) Active. NexIUM (Oral) Specific strength unknown - Active. Medications Reconciled  BP (!) 178/81   Pulse 64   Temp 98.2 F (36.8 C) (Oral)   Resp 17   Ht 5\' 11"  (1.803 m)   Wt 88.5 kg   SpO2 98%   BMI 27.20 kg/m    Physical Exam The physical exam findings are as follows: Note: Constitutional: No acute distress, conversant, appears stated age  Eyes: Anicteric sclerae, moist conjunctiva, no lid lag  Neck: No thyromegaly, trachea midline, no cervical lymphadenopathy  Lungs: Clear to auscultation biilaterally, normal respiratory effot  Cardiovascular: regular rate & rhythm, no murmurs, no peripheal edema, pedal pulses 2+  GI: Soft, no masses or hepatosplenomegaly, non-tender to palpation, large left inguinal hernia  MSK: Normal gait, no clubbing cyanosis, edema  Skin: No rashes,  palpation reveals normal skin turgor  Psychiatric: Appropriate judgment and insight, oriented to person, place, and time On exam, he looks well    Assessment & Plan LEFT INGUINAL HERNIA (K40.90) Impression: 70 year old male with a previous laparoscopic right inguinal hernia repair, now with a left inguinal hernia  1. The patient will like to proceed to the operating room for robotic left inguinal hernia repair with mesh, versus open  2. I discussed with the patient the signs and symptoms of incarceration and strangulation and the need to proceed to the ER should they occur.  3. I discussed with the patient the risks and benefits of the procedure to include but not limited to: Infection, bleeding, damage to surrounding structures, possible need for further surgery, possible nerve pain, and possible recurrence. The patient was understanding and wishes to proceed.

## 2020-11-09 NOTE — Op Note (Signed)
11/09/2020  11:21 AM  PATIENT:  Andrew Mcmahon  70 y.o. male  PRE-OPERATIVE DIAGNOSIS:  LEFT INGUINAL HERNIA  POST-OPERATIVE DIAGNOSIS:  LARGE LEFT INDIRECT, SLIDING INGUINAL HERNIA  PROCEDURE:  Procedure(s): ROBOTIC LEFT INGUINAL HERNIA WITH MESH (Left)  SURGEON:  Surgeon(s) and Role:    * Ralene Ok, MD - Primary  ASSISTANTS: SHARON HITHCOCK, RNFA   ANESTHESIA:   local and general  EBL:  minimal   BLOOD ADMINISTERED:none  DRAINS: none   LOCAL MEDICATIONS USED:  BUPIVICAINE   SPECIMEN:  No Specimen  DISPOSITION OF SPECIMEN:  N/A  COUNTS:  YES   TOURNIQUET:  * No tourniquets in log *  DICTATION: .Dragon Dictation  Details of the procedure:   The patient was taken back to the operating room. The patient was placed in supine position with bilateral SCDs in place.  A Foley catheter was placed.  The patient was prepped and draped in the usual sterile fashion.  After appropriate anitbiotics were confirmed, a time-out was confirmed and all facts were verified.  At this time a Veress needle technique was used to inspect the abdomen approximately 10 cm from the valgus and the paramedian line. This time a 8 mm robotic trocar was placed into the abdomen. The camera was placed there was no injury to any intra-abdominal organs. A 20mm umbilical port was placed just superior to the umbilicus. An 8 mm port was placed approximately 10 cm lateral to the umbilicus on the left paramedian side. Robot was positioned over the patient and the ports were docked in the usual fashion.  At this time the Left-sided peritoneum was taken down from the medial umbilical ligament laterally. The pre-peritoneal space was entered. Dissection was taken down to Cooper's ligament. At this time it was apparent there was a large sliding indirect hernia. The hernia was retracted and dissected away from the transversalis fascia. Transversalis fascia spontaneously retracted. The hernia contained mainly  preperitoneal fat and a sliding component of the sigmoid colon. At this time I proceeded to clean out the rest Cooper's ligament and the medial to lateral direction. I proceeded laterally to dissect the spermatic cord. The spermatic cord was circumferentially dissected away from the surrounding musculature and tissue. The vas deferens was identified and protected all portions of the case.  This was dissected back. At this time I proceeded to create a pocket laterally for the mesh. Once the pocket was created the peritoneum was stripped back to approximately the base of the cord. At this time the piece of 16 x12cm progrip mesh was in placed into the dissected area. This covered both the direct and indirect spaces appropriately. This also covered  The femoral space. The mesh lay flat from medial to lateral. At this time a 2 V- lock stitch was used to close the peritoneum in a standard running fashion.  At this time the robot was undocked. The umbilical port site was reapproximated using a 0 Vicryl via an Endo Close device 1. All ports were removed. The skin was reapproximated and all port sites using 4-0 Monocryl subcuticular fashion.  The patient the procedure well was taken to the recovery     PLAN OF CARE: Discharge to home after PACU  PATIENT DISPOSITION:  PACU - hemodynamically stable.   Delay start of Pharmacological VTE agent (>24hrs) due to surgical blood loss or risk of bleeding: not applicable

## 2020-11-10 ENCOUNTER — Encounter (HOSPITAL_COMMUNITY): Payer: Self-pay | Admitting: General Surgery

## 2020-12-08 DIAGNOSIS — R972 Elevated prostate specific antigen [PSA]: Secondary | ICD-10-CM | POA: Diagnosis not present

## 2020-12-13 ENCOUNTER — Other Ambulatory Visit: Payer: Self-pay | Admitting: Urology

## 2020-12-13 DIAGNOSIS — R972 Elevated prostate specific antigen [PSA]: Secondary | ICD-10-CM

## 2021-01-10 ENCOUNTER — Ambulatory Visit
Admission: RE | Admit: 2021-01-10 | Discharge: 2021-01-10 | Disposition: A | Discharge status: Home / Self Care | Payer: Medicare Other | Source: Ambulatory Visit | Attending: Urology | Admitting: Urology

## 2021-01-10 ENCOUNTER — Other Ambulatory Visit: Payer: Self-pay

## 2021-01-10 DIAGNOSIS — R972 Elevated prostate specific antigen [PSA]: Secondary | ICD-10-CM

## 2021-01-10 DIAGNOSIS — N400 Enlarged prostate without lower urinary tract symptoms: Secondary | ICD-10-CM | POA: Diagnosis not present

## 2021-01-10 DIAGNOSIS — K5730 Diverticulosis of large intestine without perforation or abscess without bleeding: Secondary | ICD-10-CM | POA: Diagnosis not present

## 2021-01-10 MED ORDER — GADOBENATE DIMEGLUMINE 529 MG/ML IV SOLN
18.0000 mL | Freq: Once | INTRAVENOUS | Status: AC | PRN
  Administered 2021-01-10: 18 mL via INTRAVENOUS

## 2021-02-01 DIAGNOSIS — M6740 Ganglion, unspecified site: Secondary | ICD-10-CM | POA: Diagnosis not present

## 2021-02-01 DIAGNOSIS — Z85828 Personal history of other malignant neoplasm of skin: Secondary | ICD-10-CM | POA: Diagnosis not present

## 2021-02-01 DIAGNOSIS — L239 Allergic contact dermatitis, unspecified cause: Secondary | ICD-10-CM | POA: Diagnosis not present

## 2021-02-02 ENCOUNTER — Other Ambulatory Visit: Payer: Self-pay

## 2021-02-02 ENCOUNTER — Encounter (HOSPITAL_BASED_OUTPATIENT_CLINIC_OR_DEPARTMENT_OTHER): Payer: Self-pay | Admitting: Obstetrics and Gynecology

## 2021-02-02 ENCOUNTER — Emergency Department (HOSPITAL_BASED_OUTPATIENT_CLINIC_OR_DEPARTMENT_OTHER)
Admission: EM | Admit: 2021-02-02 | Discharge: 2021-02-02 | Disposition: A | Discharge status: Home / Self Care | Payer: Medicare Other | Attending: Emergency Medicine | Admitting: Emergency Medicine

## 2021-02-02 DIAGNOSIS — W260XXA Contact with knife, initial encounter: Secondary | ICD-10-CM | POA: Diagnosis not present

## 2021-02-02 DIAGNOSIS — S6991XA Unspecified injury of right wrist, hand and finger(s), initial encounter: Secondary | ICD-10-CM | POA: Diagnosis present

## 2021-02-02 DIAGNOSIS — S61011A Laceration without foreign body of right thumb without damage to nail, initial encounter: Secondary | ICD-10-CM | POA: Insufficient documentation

## 2021-02-02 DIAGNOSIS — Z85828 Personal history of other malignant neoplasm of skin: Secondary | ICD-10-CM | POA: Diagnosis not present

## 2021-02-02 MED ORDER — LIDOCAINE HCL (PF) 1 % IJ SOLN
10.0000 mL | Freq: Once | INTRAMUSCULAR | Status: AC
  Filled 2021-02-02: qty 10

## 2021-02-02 NOTE — ED Provider Notes (Signed)
[facility] EMERGENCY DEPT Provider Note   CSN: [id number] Arrival date & time: 02/02/21  1259     History Chief Complaint  Patient presents with  . Laceration    Andrew Mcmahon is a 70 y.o. male.  HPI 70 year old male presents with a right thumb laceration.  This occurred just prior to arrival.  He was slicing up some fruit and accidentally had the knife go through and cut a small flap in his distal thumb.  He could not get the bleeding to stop.  That is his primary reason for coming in.  Otherwise no weakness, numbness, or problems with range of motion.  He is not sure when his last tetanus immunization was.   Past Medical History:  Diagnosis Date  . Back pain    low, hx radiculopathy, saw ortho in the past  . Closed right ankle fracture 09/2015   per pt due to fall while hiking  . Deviated nasal septum   . GERD (gastroesophageal reflux disease)   . History of kidney stones    kidney stones - h/o   . 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 Panic attack   . PONV (postoperative nausea and vomiting)   . Right inguinal hernia   . Wears glasses     Patient Active Problem List   Diagnosis Date Noted  . BMI 28.0-28.9,adult 09/15/2016  . Gastroesophageal reflux disease without esophagitis 09/15/2016  . Hyperlipidemia 09/15/2016  . 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 Right inguinal hernia 06/23/2014  . Panic attacks 07/26/2011  . SKIN CANCER, HX OF 07/16/2007    Past Surgical History:  Procedure Laterality Date  . COLONOSCOPY    . INGUINAL HERNIA REPAIR Right 11/29/2016   Procedure: LAPAROSCOPIC RIGHT  INGUINAL HERNIA;  Surgeon: Coralie Keens, MD;  Location: [facility];  Service: General;  Laterality: Right;  . INSERTION OF MESH Right 11/29/2016   Procedure: INSERTION OF MESH;  Surgeon: Coralie Keens, MD;  Location: [facility];  Service: General;  Laterality: Right;  . MASS EXCISION  08/08/2012   Procedure: MINOR EXCISION OF MASS;  Surgeon: Cammie Sickle., MD;  Location: [facility];  Service: Orthopedics;  Laterality: Right;  Minor Excision of Foreign Body  . MOHS SURGERY    . NASAL SEPTUM SURGERY    . [place] EXTRACTION  1980  . XI ROBOTIC ASSISTED INGUINAL HERNIA REPAIR WITH MESH Left 11/09/2020   Procedure: ROBOTIC LEFT INGUINAL HERNIA WITH MESH;  Surgeon: Ralene Ok, MD;  Location: [facility] OR;  Service: General;  Laterality: Left;       Family History  Problem Relation Age of Onset  . Liver disease Other   . Multiple myeloma Father        died at 4     Social History   Tobacco Use  . Smoking status: Never Smoker  . Smokeless tobacco: Never Used  . Tobacco comment: parents smoked growing up; stopped when he was a teenager  Vaping Use  . Vaping Use: Never used  Substance Use Topics  . Alcohol use: Yes    Alcohol/week: 8.0 - 10.0 standard drinks    Types: 8 - 10 Glasses of wine per week  . Drug use: No    Home Medications Prior to Admission medications   Medication Sig Start Date End Date Taking? Authorizing Provider  COLLAGEN PO Take 1 Scoop by mouth daily.    [provider]  esomeprazole (NEXIUM) 20 MG  capsule Take 20 mg by mouth every other day.    [provider]  fish oil-omega-3 fatty acids 1000 MG capsule Take 1 g by mouth daily.    [provider]  Probiotic Product (PROBIOTIC PO) Take 1 capsule by mouth daily.     [provider]  traMADol (ULTRAM) 50 MG tablet Take 1 tablet (50 mg total) by mouth every 6 (six) hours as needed. 11/09/20 11/09/21  Ralene Ok, MD  VITAMIN D PO Take 400 Units by mouth daily.    [provider]    Allergies    No known allergies  Review of Systems   Review of Systems  Skin: Positive for wound.  Neurological: Negative for weakness and numbness.    Physical Exam Updated Vital Signs BP (!) 166/93 (BP Location: Right Arm)   Pulse 67   Temp 98.1 F (36.7 C) (Oral)   Resp 18   Ht _0  (1.803  m)   Wt 88.5 kg   SpO2 95%   BMI 27.20 kg/m   Physical Exam Vitals and nursing note reviewed.  Constitutional:      Appearance: He is well-developed.  HENT:     Head: Normocephalic and atraumatic.     Right Ear: External ear normal.     Left Ear: External ear normal.     Nose: Nose normal.  Eyes:     General:        Right eye: No discharge.        Left eye: No discharge.  Pulmonary:     Effort: Pulmonary effort is normal.  Abdominal:     Tenderness: There is no abdominal tenderness.  Musculoskeletal:     Comments: On the palmar side of the right thumb just after the nail there is a small rectangular flap. It is quite superficial. bleeding stops with direct pressure.   Skin:    General: Skin is warm and dry.  Neurological:     Mental Status: He is alert.  Psychiatric:        Mood and Affect: Mood is not anxious.     ED Results / Procedures / Treatments   Labs (all labs ordered are listed, but only abnormal results are displayed) Labs Reviewed - No data to display  EKG None  Radiology No results found.  Procedures Procedures   Medications Ordered in ED Medications  lidocaine (PF) (XYLOCAINE) 1 % injection 10 mL (has no administration in time range)    ED Course  I have reviewed the triage vital signs and the nursing notes.  Pertinent labs & imaging results that were available during my care of the patient were reviewed by me and considered in my medical decision making (see chart for details).    MDM Rules/Calculators/A&P                          Patient's hand was irrigated with tap water.  There is no further bleeding.  The little flap lays down well.  He is asking for no stitches which I think is pretty reasonable given the superficial nature of the injury.  We will wrap his hand.  He is not sure about his last tetanus and for now does not want to receive immunization.   Final Clinical Impression(s) / ED Diagnoses Final diagnoses:  Laceration of right  thumb without foreign body without damage to nail, initial encounter    Rx / DC Orders ED Discharge Orders  None       Sherwood Gambler, MD 02/02/21 1330

## 2021-02-02 NOTE — ED Triage Notes (Signed)
Patient reports to the ER for right thumb laceration. Patient reports he cannot get the bleeding under control. Patient reports he cut it with a knife. Patient denies self harm.

## 2021-03-14 ENCOUNTER — Other Ambulatory Visit: Payer: Self-pay

## 2021-03-15 ENCOUNTER — Ambulatory Visit (INDEPENDENT_AMBULATORY_CARE_PROVIDER_SITE_OTHER): Payer: Medicare Other | Admitting: Internal Medicine

## 2021-03-15 ENCOUNTER — Encounter: Payer: Self-pay | Admitting: Internal Medicine

## 2021-03-15 ENCOUNTER — Other Ambulatory Visit: Payer: Self-pay | Admitting: Internal Medicine

## 2021-03-15 VITALS — BP 140/80 | HR 61 | Temp 98.3°F | Ht 70.0 in | Wt 208.3 lb

## 2021-03-15 DIAGNOSIS — E785 Hyperlipidemia, unspecified: Secondary | ICD-10-CM | POA: Diagnosis not present

## 2021-03-15 DIAGNOSIS — G44001 Cluster headache syndrome, unspecified, intractable: Secondary | ICD-10-CM | POA: Diagnosis not present

## 2021-03-15 DIAGNOSIS — F410 Panic disorder [episodic paroxysmal anxiety] without agoraphobia: Secondary | ICD-10-CM

## 2021-03-15 DIAGNOSIS — H6122 Impacted cerumen, left ear: Secondary | ICD-10-CM | POA: Diagnosis not present

## 2021-03-15 DIAGNOSIS — K219 Gastro-esophageal reflux disease without esophagitis: Secondary | ICD-10-CM

## 2021-03-15 DIAGNOSIS — Z0000 Encounter for general adult medical examination without abnormal findings: Secondary | ICD-10-CM

## 2021-03-15 DIAGNOSIS — N411 Chronic prostatitis: Secondary | ICD-10-CM

## 2021-03-15 DIAGNOSIS — Z0001 Encounter for general adult medical examination with abnormal findings: Secondary | ICD-10-CM | POA: Diagnosis not present

## 2021-03-15 LAB — COMPREHENSIVE METABOLIC PANEL
ALT: 23 U/L (ref 0–53)
AST: 20 U/L (ref 0–37)
Albumin: 4.5 g/dL (ref 3.5–5.2)
Alkaline Phosphatase: 61 U/L (ref 39–117)
BUN: 17 mg/dL (ref 6–23)
CO2: 28 mEq/L (ref 19–32)
Calcium: 9.4 mg/dL (ref 8.4–10.5)
Chloride: 102 mEq/L (ref 96–112)
Creatinine, Ser: 0.91 mg/dL (ref 0.40–1.50)
GFR: 85.59 mL/min (ref >60.00)
Glucose, Bld: 110 mg/dL — ABNORMAL HIGH (ref 70–99)
Potassium: 4.5 mEq/L (ref 3.5–5.1)
Sodium: 137 mEq/L (ref 135–145)
Total Bilirubin: 0.4 mg/dL (ref 0.2–1.2)
Total Protein: 7 g/dL (ref 6.0–8.3)

## 2021-03-15 LAB — CBC WITH DIFFERENTIAL/PLATELET
Basophils Absolute: 0.1 10*3/uL (ref 0.0–0.1)
Basophils Relative: 1.5 % (ref 0.0–3.0)
Eosinophils Absolute: 0.1 10*3/uL (ref 0.0–0.7)
Eosinophils Relative: 1.9 % (ref 0.0–5.0)
HCT: 48.6 % (ref 39.0–52.0)
Hemoglobin: 16.8 g/dL (ref 13.0–17.0)
Lymphocytes Relative: 29.8 % (ref 12.0–46.0)
Lymphs Abs: 1.7 10*3/uL (ref 0.7–4.0)
MCHC: 34.5 g/dL (ref 30.0–36.0)
MCV: 92 fl (ref 78.0–100.0)
Monocytes Absolute: 0.6 10*3/uL (ref 0.1–1.0)
Monocytes Relative: 10.1 % (ref 3.0–12.0)
Neutro Abs: 3.2 10*3/uL (ref 1.4–7.7)
Neutrophils Relative %: 56.7 % (ref 43.0–77.0)
Platelets: 185 10*3/uL (ref 150.0–400.0)
RBC: 5.28 Mil/uL (ref 4.22–5.81)
RDW: 13.8 % (ref 11.5–15.5)
WBC: 5.7 10*3/uL (ref 4.0–10.5)

## 2021-03-15 LAB — LIPID PANEL
Cholesterol: 215 mg/dL — ABNORMAL HIGH (ref 0–200)
HDL: 46.2 mg/dL (ref >39.00)
LDL Cholesterol: 154 mg/dL — ABNORMAL HIGH (ref 0–99)
NonHDL: 169.07
Total CHOL/HDL Ratio: 5
Triglycerides: 75 mg/dL (ref 0.0–149.0)
VLDL: 15 mg/dL (ref 0.0–40.0)

## 2021-03-15 MED ORDER — ALPRAZOLAM 0.25 MG PO TABS: 0.2500 mg | ORAL_TABLET | Freq: Every day | ORAL | 0 refills | Status: DC | PRN

## 2021-03-15 MED ORDER — ATORVASTATIN CALCIUM 20 MG PO TABS: 20.0000 mg | ORAL_TABLET | Freq: Every day | ORAL | 1 refills | Status: DC

## 2021-03-15 NOTE — Progress Notes (Addendum)
Established Patient Office Visit     This visit occurred during the SARS-CoV-2 public health emergency.  Safety protocols were in place, including screening questions prior to the visit, additional usage of staff PPE, and extensive cleaning of exam room while observing appropriate contact time as indicated for disinfecting solutions.    CC/Reason for Visit: Subsequent Medicare wellness visit and follow-up chronic medical conditions  HPI: Andrew Mcmahon is a 70 y.o. male who is coming in today for the above mentioned reasons. Past Medical History is significant for: Hyperlipidemia, GERD, history of prostatitis, episodic cluster headaches which is a recent diagnosis.  He has routine eye and dental care.  No perceived hearing issues.  He exercises quite routinely by swimming, walking as well as doing some weight training.  He had a negative Cologuard in 01-03-20.  He is due for his fourth COVID-vaccine and his shingles vaccination series.  Since I last saw him he had a left inguinal hernia repair in February 2022, he had an MRI of his prostate due to persistently elevated PSA.  It seems that plan is for follow-up in 6 months and only rebiopsy if PSA rises.  Unfortunately his mother passed away in 01-03-2023.  He has some history of panic attacks and is requesting refill of his Xanax 0.25 mg of which he takes half a tablet as needed.  Average usage is 1/2 tablet every few months.   Past Medical/Surgical History: Past Medical History:  Diagnosis Date  . Back pain    low, hx radiculopathy, saw ortho in the past  . Closed right ankle fracture 09/2015   per pt due to fall while hiking  . Deviated nasal septum   . GERD (gastroesophageal reflux disease)   . History of kidney stones    kidney stones - h/o   . 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 Panic attack   . PONV (postoperative nausea and vomiting)   . Right inguinal hernia   . Wears glasses     Past Surgical History:  Procedure  Laterality Date  . COLONOSCOPY    . INGUINAL HERNIA REPAIR Right 11/29/2016   Procedure: LAPAROSCOPIC RIGHT  INGUINAL HERNIA;  Surgeon: Coralie Keens, MD;  Location: [facility];  Service: General;  Laterality: Right;  . INSERTION OF MESH Right 11/29/2016   Procedure: INSERTION OF MESH;  Surgeon: Coralie Keens, MD;  Location: [facility];  Service: General;  Laterality: Right;  . MASS EXCISION  08/08/2012   Procedure: MINOR EXCISION OF MASS;  Surgeon: Cammie Sickle., MD;  Location: [facility];  Service: Orthopedics;  Laterality: Right;  Minor Excision of Foreign Body  . MOHS SURGERY    . NASAL SEPTUM SURGERY    . [place] EXTRACTION  1980  . XI ROBOTIC ASSISTED INGUINAL HERNIA REPAIR WITH MESH Left 11/09/2020   Procedure: ROBOTIC LEFT INGUINAL HERNIA WITH MESH;  Surgeon: Ralene Ok, MD;  Location: [facility];  Service: General;  Laterality: Left;    Social History:  reports that he has never smoked. He has never used smokeless tobacco. He reports current alcohol use of about 8.0 - 10.0 standard drinks of alcohol per week. He reports that he does not use drugs.  Allergies: Allergies  Allergen Reactions  . No Known Allergies     Family History:  Family History  Problem Relation Age of Onset  . Liver disease Other   . Multiple myeloma Father  died at 68      Current Outpatient Medications:  .  COLLAGEN PO, Take 1 Scoop by mouth daily., Disp: , Rfl:  .  esomeprazole (NEXIUM) 20 MG capsule, Take 20 mg by mouth every other day., Disp: , Rfl:  .  fish oil-omega-3 fatty acids 1000 MG capsule, Take 1 g by mouth daily., Disp: , Rfl:  .  Probiotic Product (PROBIOTIC PO), Take 1 capsule by mouth daily. , Disp: , Rfl:  .  VITAMIN D PO, Take 400 Units by mouth daily., Disp: , Rfl:  .  ALPRAZolam (XANAX) 0.25 MG tablet, Take 1 tablet (0.25 mg total) by mouth daily as needed for anxiety., Disp: 30 tablet, Rfl: 0  Review of Systems:  Constitutional: Denies fever,  chills, diaphoresis, appetite change and fatigue.  HEENT: Denies photophobia, eye pain, redness, hearing loss, ear pain, congestion, sore throat, rhinorrhea, sneezing, mouth sores, trouble swallowing, neck pain, neck stiffness and tinnitus.   Respiratory: Denies SOB, DOE, cough, chest tightness,  and wheezing.   Cardiovascular: Denies chest pain, palpitations and leg swelling.  Gastrointestinal: Denies nausea, vomiting, abdominal pain, diarrhea, constipation, blood in stool and abdominal distention.  Genitourinary: Denies dysuria, urgency, frequency, hematuria, flank pain and difficulty urinating.  Endocrine: Denies: hot or cold intolerance, sweats, changes in hair or nails, polyuria, polydipsia. Musculoskeletal: Denies myalgias, back pain, joint swelling, arthralgias and gait problem.  Skin: Denies pallor, rash and wound.  Neurological: Denies dizziness, seizures, syncope, weakness, light-headedness, numbness and headaches.  Hematological: Denies adenopathy. Easy bruising, personal or family bleeding history  Psychiatric/Behavioral: Denies suicidal ideation, mood changes, confusion, nervousness, sleep disturbance and agitation    Physical Exam: Vitals:   03/15/21 0908  BP: 140/80  Pulse: 61  Temp: 98.3 F (36.8 C)  TempSrc: Oral  SpO2: 98%  Weight: 208 lb 4.8 oz (94.5 kg)  Height: '5\' 10"'  (1.778 m)    Body mass index is 29.89 kg/m.   Constitutional: NAD, calm, comfortable Eyes: PERRL, lids and conjunctivae normal, wears corrective lenses ENMT: Mucous membranes are moist. Posterior pharynx clear of any exudate or lesions. Normal dentition. Tympanic membrane is pearly white, no erythema or bulging on the right, left is impacted by cerumen. Neck: normal, supple, no masses, no thyromegaly Respiratory: clear to auscultation bilaterally, no wheezing, no crackles. Normal respiratory effort. No accessory muscle use.  Cardiovascular: Regular rate and rhythm, no murmurs / rubs / gallops.  No extremity edema. 2+ pedal pulses. No carotid bruits.  Abdomen: no tenderness, no masses palpated. No hepatosplenomegaly. Bowel sounds positive.  Musculoskeletal: no clubbing / cyanosis. No joint deformity upper and lower extremities. Good ROM, no contractures. Normal muscle tone.  Skin: no rashes, lesions, ulcers. No induration Neurologic: CN 2-12 grossly intact. Sensation intact, DTR normal. Strength 5/5 in all 4.  Psychiatric: Normal judgment and insight. Alert and oriented x 3. Normal mood.    Subsequent Medicare wellness visit   1. Risk factors, based on past  M,S,F -cardiovascular disease risk factors include age, gender, history of hyperlipidemia.   2.  Physical activities: He is quite physically active as described above   3.  Depression/mood:  Mood is stable, no depression   4.  Hearing:  Mildly decreased hearing, nothing significant   5.  ADL's: Independent in all ADLs   6.  Fall risk:  Low fall risk   7.  Home safety: No problems identified   8.  Height weight, and visual acuity: height and weight as above, vision:   Visual Acuity Screening  Right eye Left eye Both eyes  Without correction:     With correction: '20/20 20/20 20/16 '     9.  Counseling:  Advised he update his vaccination series   10. Lab orders based on risk factors: Laboratory update will be reviewed   11. Referral :  None today   12. Care plan:  Follow-up with me in 6 months   13. Cognitive assessment:  No cognitive impairment   14. Screening: Patient provided with a written and personalized 5-10 year screening schedule in the AVS.   yes   15. Provider List Update:   PCP only  16. Advance Directives: Full code   17. Opioids: Patient is not on any opioid prescriptions and has no risk factors for a substance use disorder.   [facility] Office Visit from 03/15/2021 in [facility]  PHQ-9 Total Score 2      Fall Risk  03/15/2021 03/11/2020 01/29/2018 06/12/2016  Falls  in the past year? 0 0 No Yes  Number falls in past yr: 0 0 - 1  Injury with Fall? 0 0 - Yes     Impression and Plan:  Medicare annual wellness visit, subsequent -He has routine eye and dental care. -He is due for his fourth COVID-vaccine and his shingles series which she will obtain at pharmacy, otherwise immunizations are up-to-date and age-appropriate. -Screening labs today. -Healthy lifestyle discussed in detail. -He had a negative Cologuard in 2021.  Gastroesophageal reflux disease without esophagitis -He takes Nexium every other day, is well controlled.  Hyperlipidemia, unspecified hyperlipidemia type  -Check lipids today, last cholesterol panel in May 2021 with total cholesterol 218, LDL 144 and triglycerides 163.  Panic attacks  - Plan: ALPRAZolam (XANAX) 0.25 MG tablet, as above very sporadic usage.  Intractable cluster headache syndrome, unspecified chronicity pattern -He has had no further episodes, we plan referral to neurology if this recurs.  Chronic prostatitis -Followed by urology, recent MRI of the prostate.  Left ear impacted cerumen -Cerumen Desimpaction  After patient consent was obtained, warm water was applied and gentle ear lavage performed on left ear. There were no complications and following the desimpaction the tympanic membranes were visible. Tympanic membranes are intact following the procedure. Auditory canals are normal. The patient reported relief of symptoms after removal of cerumen.    Patient Instructions   -Nice seeing you today!!  -Lab work today; will notify you once results are available.  -Remember your 4th COVID and shingles vaccines at your pharmacy.  -Schedule follow up in 1 year or sooner as needed.   Preventive Care 30 Years and Older, Male Preventive care refers to lifestyle choices and visits with your health care provider that can promote health and wellness. This includes:  A yearly physical exam. This is also called  an annual wellness visit.  Regular dental and eye exams.  Immunizations.  Screening for certain conditions.  Healthy lifestyle choices, such as: ? Eating a healthy diet. ? Getting regular exercise. ? Not using drugs or products that contain nicotine and tobacco. ? Limiting alcohol use. What can I expect for my preventive care visit? Physical exam Your health care provider will check your:  Height and weight. These may be used to calculate your BMI (body mass index). BMI is a measurement that tells if you are at a healthy weight.  Heart rate and blood pressure.  Body temperature.  Skin for abnormal spots. Counseling Your health care provider may ask you questions about your:  Past medical problems.  Family's medical history.  Alcohol, tobacco, and drug use.  Emotional well-being.  Home life and relationship well-being.  Sexual activity.  Diet, exercise, and sleep habits.  History of falls.  Memory and ability to understand (cognition).  Work and work Statistician.  Access to firearms. What immunizations do I need? Vaccines are usually given at various ages, according to a schedule. Your health care provider will recommend vaccines for you based on your age, medical history, and lifestyle or other factors, such as travel or where you work.   What tests do I need? Blood tests  Lipid and cholesterol levels. These may be checked every 5 years, or more often depending on your overall health.  Hepatitis C test.  Hepatitis B test. Screening  Lung cancer screening. You may have this screening every year starting at age 8 if you have a 30-pack-year history of smoking and currently smoke or have quit within the past 15 years.  Colorectal cancer screening. ? All adults should have this screening starting at age 75 and continuing until age 57. ? Your health care provider may recommend screening at age 62 if you are at increased risk. ? You will have tests every  1-10 years, depending on your results and the type of screening test.  Prostate cancer screening. Recommendations will vary depending on your family history and other risks.  Genital exam to check for testicular cancer or hernias.  Diabetes screening. ? This is done by checking your blood sugar (glucose) after you have not eaten for a while (fasting). ? You may have this done every 1-3 years.  Abdominal aortic aneurysm (AAA) screening. You may need this if you are a current or former smoker.  STD (sexually transmitted disease) testing, if you are at risk. Follow these instructions at home: Eating and drinking  Eat a diet that includes fresh fruits and vegetables, whole grains, lean protein, and low-fat dairy products. Limit your intake of foods with high amounts of sugar, saturated fats, and salt.  Take vitamin and mineral supplements as recommended by your health care provider.  Do not drink alcohol if your health care provider tells you not to drink.  If you drink alcohol: ? Limit how much you have to 0-2 drinks a day. ? Be aware of how much alcohol is in your drink. In the [place] one drink equals one 12 oz bottle of beer (355 mL), one 5 oz glass of wine (148 mL), or one 1 oz glass of hard liquor (44 mL).   Lifestyle  Take daily care of your teeth and gums. Brush your teeth every morning and night with fluoride toothpaste. Floss one time each day.  Stay active. Exercise for at least 30 minutes 5 or more days each week.  Do not use any products that contain nicotine or tobacco, such as cigarettes, e-cigarettes, and chewing tobacco. If you need help quitting, ask your health care provider.  Do not use drugs.  If you are sexually active, practice safe sex. Use a condom or other form of protection to prevent STIs (sexually transmitted infections).  Talk with your health care provider about taking a low-dose aspirin or statin.  Find healthy ways to cope with stress, such  as: ? Meditation, yoga, or listening to music. ? Journaling. ? Talking to a trusted person. ? Spending time with friends and family. Safety  Always wear your seat belt while driving or riding in a vehicle.  Do not drive: ? If you  have been drinking alcohol. Do not ride with someone who has been drinking. ? When you are tired or distracted. ? While texting.  Wear a helmet and other protective equipment during sports activities.  If you have firearms in your house, make sure you follow all gun safety procedures. What's next?  Visit your health care provider once a year for an annual wellness visit.  Ask your health care provider how often you should have your eyes and teeth checked.  Stay up to date on all vaccines. This information is not intended to replace advice given to you by your health care provider. Make sure you discuss any questions you have with your health care provider. Document Revised: 07/15/2019 Document Reviewed: 10/10/2018 Elsevier Patient Education  2021 [place], [place] [facility]

## 2021-03-15 NOTE — Patient Instructions (Signed)
-Nice seeing you today!!  -Lab work today; will notify you once results are available.  -Remember your 4th COVID and shingles vaccines at your pharmacy.  -Schedule follow up in 1 year or sooner as needed.   Preventive Care 70 Years and Older, Male Preventive care refers to lifestyle choices and visits with your health care provider that can promote health and wellness. This includes:  A yearly physical exam. This is also called an annual wellness visit.  Regular dental and eye exams.  Immunizations.  Screening for certain conditions.  Healthy lifestyle choices, such as: ? Eating a healthy diet. ? Getting regular exercise. ? Not using drugs or products that contain nicotine and tobacco. ? Limiting alcohol use. What can I expect for my preventive care visit? Physical exam Your health care provider will check your:  Height and weight. These may be used to calculate your BMI (body mass index). BMI is a measurement that tells if you are at a healthy weight.  Heart rate and blood pressure.  Body temperature.  Skin for abnormal spots. Counseling Your health care provider may ask you questions about your:  Past medical problems.  Family's medical history.  Alcohol, tobacco, and drug use.  Emotional well-being.  Home life and relationship well-being.  Sexual activity.  Diet, exercise, and sleep habits.  History of falls.  Memory and ability to understand (cognition).  Work and work Statistician.  Access to firearms. What immunizations do I need? Vaccines are usually given at various ages, according to a schedule. Your health care provider will recommend vaccines for you based on your age, medical history, and lifestyle or other factors, such as travel or where you work.   What tests do I need? Blood tests  Lipid and cholesterol levels. These may be checked every 5 years, or more often depending on your overall health.  Hepatitis C test.  Hepatitis B  test. Screening  Lung cancer screening. You may have this screening every year starting at age 70 if you have a 30-pack-year history of smoking and currently smoke or have quit within the past 15 years.  Colorectal cancer screening. ? All adults should have this screening starting at age 70 and continuing until age 53. ? Your health care provider may recommend screening at age 96 if you are at increased risk. ? You will have tests every 1-10 years, depending on your results and the type of screening test.  Prostate cancer screening. Recommendations will vary depending on your family history and other risks.  Genital exam to check for testicular cancer or hernias.  Diabetes screening. ? This is done by checking your blood sugar (glucose) after you have not eaten for a while (fasting). ? You may have this done every 1-3 years.  Abdominal aortic aneurysm (AAA) screening. You may need this if you are a current or former smoker.  STD (sexually transmitted disease) testing, if you are at risk. Follow these instructions at home: Eating and drinking  Eat a diet that includes fresh fruits and vegetables, whole grains, lean protein, and low-fat dairy products. Limit your intake of foods with high amounts of sugar, saturated fats, and salt.  Take vitamin and mineral supplements as recommended by your health care provider.  Do not drink alcohol if your health care provider tells you not to drink.  If you drink alcohol: ? Limit how much you have to 0-2 drinks a day. ? Be aware of how much alcohol is in your drink. In the [place]  one drink equals one 12 oz bottle of beer (355 mL), one 5 oz glass of wine (148 mL), or one 1 oz glass of hard liquor (44 mL).   Lifestyle  Take daily care of your teeth and gums. Brush your teeth every morning and night with fluoride toothpaste. Floss one time each day.  Stay active. Exercise for at least 30 minutes 5 or more days each week.  Do not use any products  that contain nicotine or tobacco, such as cigarettes, e-cigarettes, and chewing tobacco. If you need help quitting, ask your health care provider.  Do not use drugs.  If you are sexually active, practice safe sex. Use a condom or other form of protection to prevent STIs (sexually transmitted infections).  Talk with your health care provider about taking a low-dose aspirin or statin.  Find healthy ways to cope with stress, such as: ? Meditation, yoga, or listening to music. ? Journaling. ? Talking to a trusted person. ? Spending time with friends and family. Safety  Always wear your seat belt while driving or riding in a vehicle.  Do not drive: ? If you have been drinking alcohol. Do not ride with someone who has been drinking. ? When you are tired or distracted. ? While texting.  Wear a helmet and other protective equipment during sports activities.  If you have firearms in your house, make sure you follow all gun safety procedures. What's next?  Visit your health care provider once a year for an annual wellness visit.  Ask your health care provider how often you should have your eyes and teeth checked.  Stay up to date on all vaccines. This information is not intended to replace advice given to you by your health care provider. Make sure you discuss any questions you have with your health care provider. Document Revised: 07/15/2019 Document Reviewed: 10/10/2018 Elsevier Patient Education  2021 [employer].

## 2021-03-16 ENCOUNTER — Telehealth: Payer: Self-pay | Admitting: Internal Medicine

## 2021-03-16 NOTE — Telephone Encounter (Signed)
Patient is returning the call for lab results, please advise. CB is [phone]

## 2021-03-17 ENCOUNTER — Encounter: Payer: Self-pay | Admitting: *Deleted

## 2021-03-17 ENCOUNTER — Other Ambulatory Visit: Payer: Self-pay | Admitting: Internal Medicine

## 2021-03-17 DIAGNOSIS — Z0000 Encounter for general adult medical examination without abnormal findings: Secondary | ICD-10-CM

## 2021-03-17 DIAGNOSIS — E785 Hyperlipidemia, unspecified: Secondary | ICD-10-CM

## 2021-03-17 NOTE — Telephone Encounter (Signed)
Pt is returning the call to [facility]

## 2021-03-17 NOTE — Telephone Encounter (Signed)
Left message on machine for patient to return our call.  See lab result note. 

## 2021-03-17 NOTE — Telephone Encounter (Signed)
Reviewed lab results with patient.

## 2021-05-31 ENCOUNTER — Other Ambulatory Visit: Payer: Self-pay

## 2021-06-01 ENCOUNTER — Ambulatory Visit (INDEPENDENT_AMBULATORY_CARE_PROVIDER_SITE_OTHER): Payer: Medicare Other | Admitting: Internal Medicine

## 2021-06-01 ENCOUNTER — Encounter: Payer: Self-pay | Admitting: Internal Medicine

## 2021-06-01 VITALS — BP 130/90 | HR 64 | Temp 98.0°F | Wt 207.5 lb

## 2021-06-01 DIAGNOSIS — H6123 Impacted cerumen, bilateral: Secondary | ICD-10-CM

## 2021-06-01 NOTE — Progress Notes (Signed)
AcuteOffice Visit     This visit occurred during the SARS-CoV-2 public health emergency.  Safety protocols were in place, including screening questions prior to the visit, additional usage of staff PPE, and extensive cleaning of exam room while observing appropriate contact time as indicated for disinfecting solutions.    CC/Reason for Visit: "water in my ear"  HPI: Andrew Mcmahon is a 70 y.o. male who is coming in today for the above mentioned reasons. He usually accumulates a lot of cerumen. Was swimming and after, noticed a pressure sensation in the right ear. No pain no discharge.  Past Medical/Surgical History: Past Medical History:  Diagnosis Date   Back pain    low, hx radiculopathy, saw ortho in the past   Closed right ankle fracture 09/2015   per pt due to fall while hiking   Deviated nasal septum    GERD (gastroesophageal reflux disease)    History of kidney stones    kidney stones - h/o    Non-melanoma skin cancer    hx mohs on R face   Panic attack    PONV (postoperative nausea and vomiting)    Right inguinal hernia    Wears glasses     Past Surgical History:  Procedure Laterality Date   COLONOSCOPY     INGUINAL HERNIA REPAIR Right 11/29/2016   Procedure: LAPAROSCOPIC RIGHT  INGUINAL HERNIA;  Surgeon: Coralie Keens, MD;  Location: [place];  Service: General;  Laterality: Right;   INSERTION OF MESH Right 11/29/2016   Procedure: INSERTION OF MESH;  Surgeon: Coralie Keens, MD;  Location: [place];  Service: General;  Laterality: Right;   MASS EXCISION  08/08/2012   Procedure: MINOR EXCISION OF MASS;  Surgeon: Cammie Sickle., MD;  Location: [place];  Service: Orthopedics;  Laterality: Right;  Minor Excision of Foreign Body   MOHS SURGERY     NASAL SEPTUM SURGERY     WISDOM TOOTH EXTRACTION  1980   XI ROBOTIC ASSISTED INGUINAL HERNIA REPAIR WITH MESH Left 11/09/2020   Procedure: ROBOTIC LEFT INGUINAL HERNIA WITH MESH;  Surgeon: Ralene Ok, MD;  Location: [place];  Service: General;  Laterality: Left;    Social History:  reports that he has never smoked. He has never used smokeless tobacco. He reports current alcohol use of about 8.0 - 10.0 standard drinks of alcohol per week. He reports that he does not use drugs.  Allergies: Allergies  Allergen Reactions   No Known Allergies     Family History:  Family History  Problem Relation Age of Onset   Liver disease Other    Multiple myeloma Father        died at 85      Current Outpatient Medications:    ALPRAZolam (XANAX) 0.25 MG tablet, Take 1 tablet (0.25 mg total) by mouth daily as needed for anxiety., Disp: 30 tablet, Rfl: 0   atorvastatin (LIPITOR) 20 MG tablet, Take 1 tablet (20 mg total) by mouth daily., Disp: 90 tablet, Rfl: 1   COLLAGEN PO, Take 1 Scoop by mouth daily., Disp: , Rfl:    esomeprazole (NEXIUM) 20 MG capsule, Take 20 mg by mouth every other day., Disp: , Rfl:    fish oil-omega-3 fatty acids 1000 MG capsule, Take 1 g by mouth daily., Disp: , Rfl:    Probiotic Product (PROBIOTIC PO), Take 1 capsule by mouth daily. , Disp: , Rfl:    VITAMIN D PO, Take 400 Units by  mouth daily., Disp: , Rfl:   Review of Systems:  Constitutional: Denies fever, chills, diaphoresis, appetite change and fatigue.  HEENT: Denies photophobia, eye pain, redness, congestion, sore throat, rhinorrhea, sneezing, mouth sores, trouble swallowing, neck pain, neck stiffness and tinnitus.   Respiratory: Denies SOB, DOE, cough, chest tightness,  and wheezing.   Cardiovascular: Denies chest pain, palpitations and leg swelling.  Gastrointestinal: Denies nausea, vomiting, abdominal pain, diarrhea, constipation, blood in stool and abdominal distention.  Genitourinary: Denies dysuria, urgency, frequency, hematuria, flank pain and difficulty urinating.  Endocrine: Denies: hot or cold intolerance, sweats, changes in hair or nails, polyuria, polydipsia. Musculoskeletal: Denies myalgias,  back pain, joint swelling, arthralgias and gait problem.  Skin: Denies pallor, rash and wound.  Neurological: Denies dizziness, seizures, syncope, weakness, light-headedness, numbness and headaches.  Hematological: Denies adenopathy. Easy bruising, personal or family bleeding history  Psychiatric/Behavioral: Denies suicidal ideation, mood changes, confusion, nervousness, sleep disturbance and agitation    Physical Exam: Vitals:   06/01/21 0735  BP: 130/90  Pulse: 64  Temp: 98 F (36.7 C)  TempSrc: Oral  SpO2: 97%  Weight: 207 lb 8 oz (94.1 kg)    Body mass index is 29.77 kg/m.   Constitutional: NAD, calm, comfortable Eyes: PERRL, lids and conjunctivae normal, wears corrective lenses. ENMT: Mucous membranes are moist.  Tympanic membrane is completely obstructed by cerumen bilaterally. Neurologic: grossly intact and non-focal. Psychiatric: Normal judgment and insight. Alert and oriented x 3. Normal mood.    Impression and Plan:  Bilateral impacted cerumen -Cerumen Desimpaction  After patient consent was obtained, warm water was applied and gentle ear lavage performed on bilateral ears. There were no complications and following the desimpaction the tympanic membranes were visible. Tympanic membranes are intact following the procedure. Auditory canals are normal. The patient reported relief of symptoms after removal of cerumen.    Lelon Frohlich, MD [facility] at [facility]

## 2021-06-28 DIAGNOSIS — H25013 Cortical age-related cataract, bilateral: Secondary | ICD-10-CM | POA: Diagnosis not present

## 2021-06-28 DIAGNOSIS — H5203 Hypermetropia, bilateral: Secondary | ICD-10-CM | POA: Diagnosis not present

## 2021-06-28 DIAGNOSIS — H2513 Age-related nuclear cataract, bilateral: Secondary | ICD-10-CM | POA: Diagnosis not present

## 2021-06-28 DIAGNOSIS — H524 Presbyopia: Secondary | ICD-10-CM | POA: Diagnosis not present

## 2021-08-22 DIAGNOSIS — Z23 Encounter for immunization: Secondary | ICD-10-CM | POA: Diagnosis not present

## 2021-08-30 DIAGNOSIS — L812 Freckles: Secondary | ICD-10-CM | POA: Diagnosis not present

## 2021-08-30 DIAGNOSIS — L570 Actinic keratosis: Secondary | ICD-10-CM | POA: Diagnosis not present

## 2021-08-30 DIAGNOSIS — M7130 Other bursal cyst, unspecified site: Secondary | ICD-10-CM | POA: Diagnosis not present

## 2021-08-30 DIAGNOSIS — L821 Other seborrheic keratosis: Secondary | ICD-10-CM | POA: Diagnosis not present

## 2021-08-30 DIAGNOSIS — D1801 Hemangioma of skin and subcutaneous tissue: Secondary | ICD-10-CM | POA: Diagnosis not present

## 2021-08-30 DIAGNOSIS — D485 Neoplasm of uncertain behavior of skin: Secondary | ICD-10-CM | POA: Diagnosis not present

## 2021-08-30 DIAGNOSIS — Z85828 Personal history of other malignant neoplasm of skin: Secondary | ICD-10-CM | POA: Diagnosis not present

## 2021-09-12 DIAGNOSIS — M25571 Pain in right ankle and joints of right foot: Secondary | ICD-10-CM | POA: Diagnosis not present

## 2021-09-19 ENCOUNTER — Other Ambulatory Visit (INDEPENDENT_AMBULATORY_CARE_PROVIDER_SITE_OTHER): Payer: Medicare Other

## 2021-09-19 DIAGNOSIS — E785 Hyperlipidemia, unspecified: Secondary | ICD-10-CM | POA: Diagnosis not present

## 2021-09-19 LAB — LIPID PANEL
Cholesterol: 220 mg/dL — ABNORMAL HIGH (ref 0–200)
HDL: 45.3 mg/dL (ref >39.00)
LDL Cholesterol: 138 mg/dL — ABNORMAL HIGH (ref 0–99)
NonHDL: 174.21
Total CHOL/HDL Ratio: 5
Triglycerides: 182 mg/dL — ABNORMAL HIGH (ref 0.0–149.0)
VLDL: 36.4 mg/dL (ref 0.0–40.0)

## 2021-09-20 DIAGNOSIS — R972 Elevated prostate specific antigen [PSA]: Secondary | ICD-10-CM | POA: Diagnosis not present

## 2021-09-20 LAB — PSA: PSA: 4.45

## 2021-09-27 DIAGNOSIS — R972 Elevated prostate specific antigen [PSA]: Secondary | ICD-10-CM | POA: Diagnosis not present

## 2021-10-06 ENCOUNTER — Encounter: Payer: Self-pay | Admitting: Internal Medicine

## 2021-11-03 ENCOUNTER — Emergency Department (HOSPITAL_BASED_OUTPATIENT_CLINIC_OR_DEPARTMENT_OTHER)
Admission: EM | Admit: 2021-11-03 | Discharge: 2021-11-03 | Discharge status: Against Medical Advice | Payer: Medicare Other | Attending: Emergency Medicine | Admitting: Emergency Medicine

## 2021-11-03 ENCOUNTER — Emergency Department (HOSPITAL_BASED_OUTPATIENT_CLINIC_OR_DEPARTMENT_OTHER): Payer: Medicare Other | Admitting: Radiology

## 2021-11-03 ENCOUNTER — Other Ambulatory Visit: Payer: Self-pay

## 2021-11-03 ENCOUNTER — Encounter (HOSPITAL_BASED_OUTPATIENT_CLINIC_OR_DEPARTMENT_OTHER): Payer: Self-pay | Admitting: Obstetrics and Gynecology

## 2021-11-03 DIAGNOSIS — Z5321 Procedure and treatment not carried out due to patient leaving prior to being seen by health care provider: Secondary | ICD-10-CM | POA: Insufficient documentation

## 2021-11-03 DIAGNOSIS — S60944A Unspecified superficial injury of right ring finger, initial encounter: Secondary | ICD-10-CM | POA: Diagnosis not present

## 2021-11-03 DIAGNOSIS — M79644 Pain in right finger(s): Secondary | ICD-10-CM | POA: Diagnosis not present

## 2021-11-03 NOTE — ED Triage Notes (Signed)
Patient reports right hand injury that hurt his right ring finger.

## 2021-11-08 ENCOUNTER — Ambulatory Visit: Payer: Medicare Other | Admitting: Internal Medicine

## 2021-12-13 DIAGNOSIS — M20011 Mallet finger of right finger(s): Secondary | ICD-10-CM | POA: Diagnosis not present

## 2021-12-13 DIAGNOSIS — M79644 Pain in right finger(s): Secondary | ICD-10-CM | POA: Diagnosis not present

## 2022-01-03 DIAGNOSIS — M79644 Pain in right finger(s): Secondary | ICD-10-CM | POA: Diagnosis not present

## 2022-01-03 DIAGNOSIS — M20011 Mallet finger of right finger(s): Secondary | ICD-10-CM | POA: Diagnosis not present

## 2022-01-19 DIAGNOSIS — K045 Chronic apical periodontitis: Secondary | ICD-10-CM | POA: Diagnosis not present

## 2022-01-19 DIAGNOSIS — T180XXS Foreign body in mouth, sequela: Secondary | ICD-10-CM | POA: Diagnosis not present

## 2022-01-24 DIAGNOSIS — M20011 Mallet finger of right finger(s): Secondary | ICD-10-CM | POA: Diagnosis not present

## 2022-01-24 DIAGNOSIS — M25641 Stiffness of right hand, not elsewhere classified: Secondary | ICD-10-CM | POA: Diagnosis not present

## 2022-02-01 DIAGNOSIS — M25641 Stiffness of right hand, not elsewhere classified: Secondary | ICD-10-CM | POA: Diagnosis not present

## 2022-02-01 DIAGNOSIS — M20011 Mallet finger of right finger(s): Secondary | ICD-10-CM | POA: Diagnosis not present

## 2022-02-08 DIAGNOSIS — M25641 Stiffness of right hand, not elsewhere classified: Secondary | ICD-10-CM | POA: Diagnosis not present

## 2022-02-08 DIAGNOSIS — M20011 Mallet finger of right finger(s): Secondary | ICD-10-CM | POA: Diagnosis not present

## 2022-02-15 DIAGNOSIS — M20011 Mallet finger of right finger(s): Secondary | ICD-10-CM | POA: Diagnosis not present

## 2022-02-15 DIAGNOSIS — M25641 Stiffness of right hand, not elsewhere classified: Secondary | ICD-10-CM | POA: Diagnosis not present

## 2022-02-22 DIAGNOSIS — M20011 Mallet finger of right finger(s): Secondary | ICD-10-CM | POA: Diagnosis not present

## 2022-02-22 DIAGNOSIS — M25641 Stiffness of right hand, not elsewhere classified: Secondary | ICD-10-CM | POA: Diagnosis not present

## 2022-02-23 DIAGNOSIS — M20011 Mallet finger of right finger(s): Secondary | ICD-10-CM | POA: Diagnosis not present

## 2022-03-01 DIAGNOSIS — M25641 Stiffness of right hand, not elsewhere classified: Secondary | ICD-10-CM | POA: Diagnosis not present

## 2022-03-01 DIAGNOSIS — M20011 Mallet finger of right finger(s): Secondary | ICD-10-CM | POA: Diagnosis not present

## 2022-03-28 DIAGNOSIS — M20011 Mallet finger of right finger(s): Secondary | ICD-10-CM | POA: Diagnosis not present

## 2022-04-05 IMAGING — MR MR PROSTATE WO/W CM
12 series · 48 of 48 positions shown · IV contrast (multihance)
Comparison: None.

CLINICAL DATA: Elevated PSA level of 4.89 on 12/09/2020. Biopsy on
08/03/2009 showed benign findings.

EXAM:
MR PROSTATE WITHOUT AND WITH CONTRAST
TECHNIQUE: Multiplanar multisequence MRI images were obtained of the pelvis
centered about the prostate. Pre and post contrast images were
obtained.
CONTRAST:  18mL MULTIHANCE GADOBENATE DIMEGLUMINE 529 MG/ML IV SOLN

[Series 3: T2 · coronal · 3.0mm · 0.56mm/px · 1 of 23 slices shown (1 of 3)]
[im 1/23]
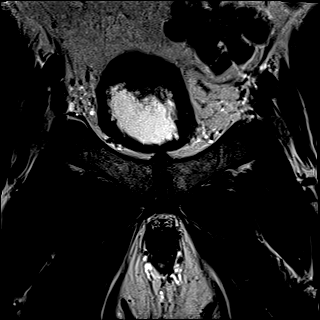

[Series 5: DWI · axial · 3.0mm · 1.75mm/px · 1 of 77 slices shown (1 of 3)]
[im 1/77]
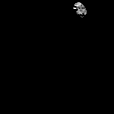

[Series 6: DWI · axial · 3.0mm · 1.75mm/px · 1 of 26 slices shown (2 of 3)]
[im 1/26]
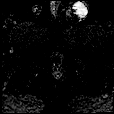

[Series 7: DWI · axial · 3.0mm · 1.75mm/px · 1 of 26 slices shown (3 of 3)]
[im 1/26]
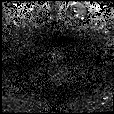

[Series 8: T1 · axial · 5.0mm · 1.25mm/px · 1 of 80 slices shown]
[im 1/80]
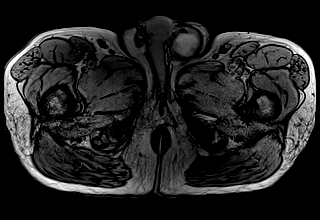

[Series 9: T2 · axial · 3.0mm · 0.56mm/px · 1 of 25 slices shown (2 of 3)]
[im 1/25]
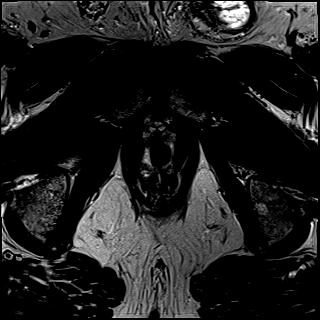

[Series 10: T2 · axial · 1.0mm · 1.04mm/px · z∈[+81,+152]mm · 2 of 72 slices shown (3 of 3)]
[im 1/72]
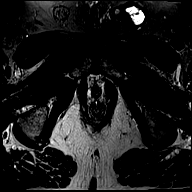
[im 72/72]
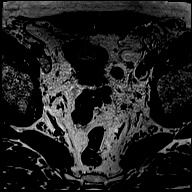

[Series 11: pre t1_twist_tra_dyn · axial · non-contrast · 3.5mm · 0.83mm/px · 1 of 22 slices shown]
[im 1/22]
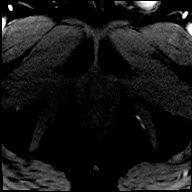

[Series 12: post t1_twist_tra_dyn-copy center · axial · non-contrast · 3.5mm · 0.83mm/px · z∈[+79,+153]mm · 18 of 660 slices shown]
[im 1/660]
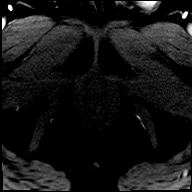
[im 39/660]
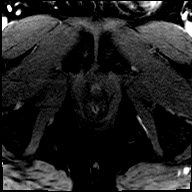
[im 78/660]
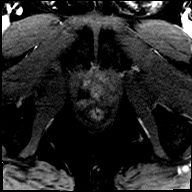
[im 117/660]
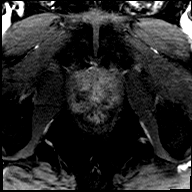
[im 156/660]
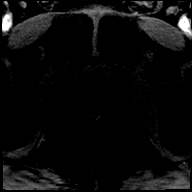
[im 194/660]
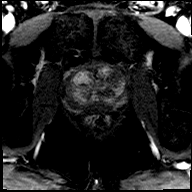
[im 233/660]
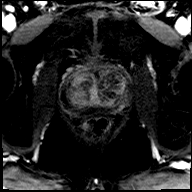
[im 272/660]
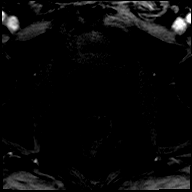
[im 311/660]
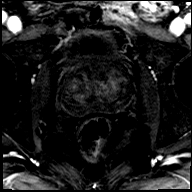
[im 349/660]
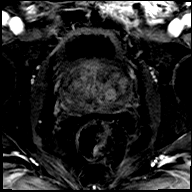
[im 388/660]
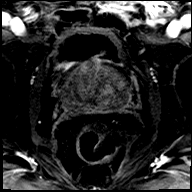
[im 427/660]
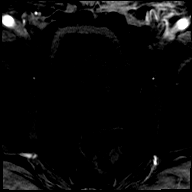
[im 466/660]
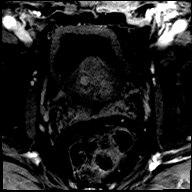
[im 504/660]
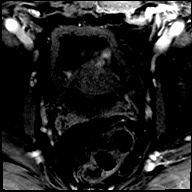
[im 543/660]
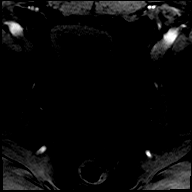
[im 582/660]
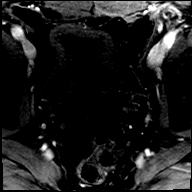
[im 621/660]
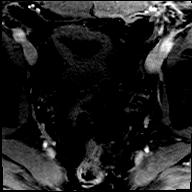
[im 660/660]
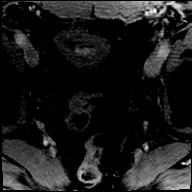

[Series 13: post t1_twist_tra_dyn-copy cent_sub · axial · 3.5mm · 0.83mm/px · z∈[+79,+153]mm · 17 of 638 slices shown]
[im 1/638]
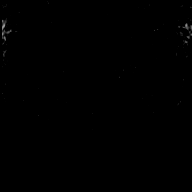
[im 40/638]
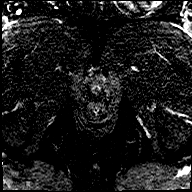
[im 80/638]
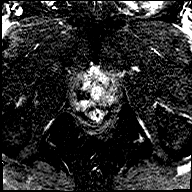
[im 120/638]
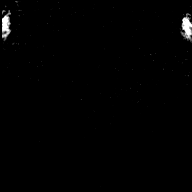
[im 160/638]
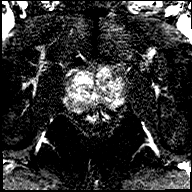
[im 200/638]
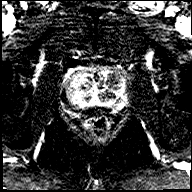
[im 239/638]
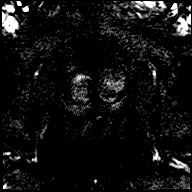
[im 279/638]
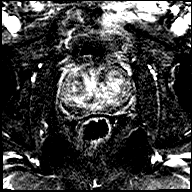
[im 319/638]
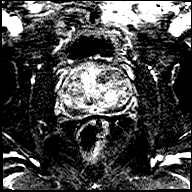
[im 359/638]
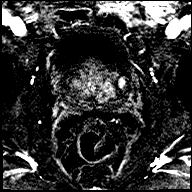
[im 399/638]
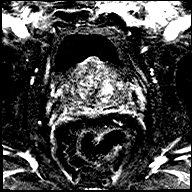
[im 438/638]
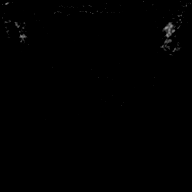
[im 478/638]
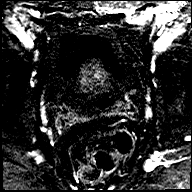
[im 518/638]
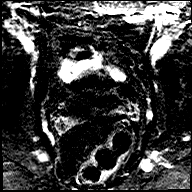
[im 558/638]
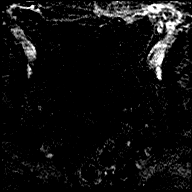
[im 598/638]
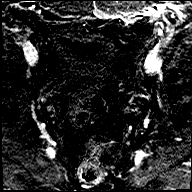
[im 638/638]
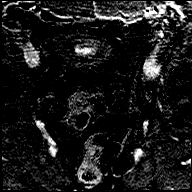

[Series 14: t1_vibe_dixon_tra_f · axial · 2.5mm · 0.91mm/px · z∈[+52,+250]mm · 2 of 80 slices shown]
[im 1/80]
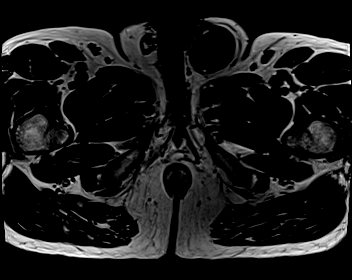
[im 80/80]
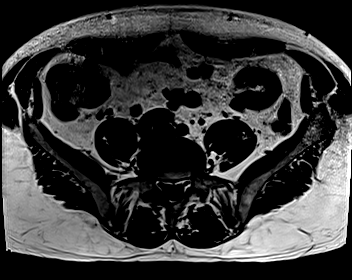

[Series 15: t1_vibe_dixon_tra_w · axial · 2.5mm · 0.91mm/px · z∈[+52,+250]mm · 2 of 80 slices shown]
[im 1/80]
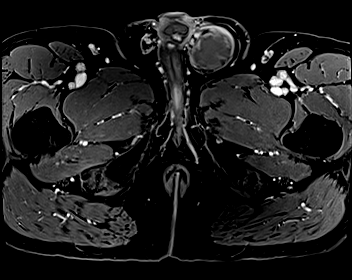
[im 80/80]
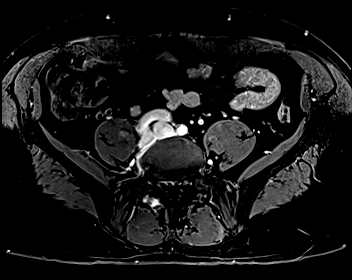

[48 of 48 positions shown; findings below may reference images not displayed]

FINDINGS: Prostate:

Region measures test type 1: PI-RADS category 3 lesion of the left
anterior and posterolateral peripheral zone at the apex, with low T2
signal but no definite focal early enhancement or restricted
diffusion. This measures 1.23 cubic cm (2.5 by 0.9 by 1.4 cm) and is
shown for example on image 58 of series 10.

Encapsulated nodularity in the transition zone compatible with
benign prostatic hypertrophy.

Volume: 3D volumetric analysis: Prostate volume 87.51 cubic cm (6.4
by 5.4 by 5.7 cm).

Transcapsular spread:  Absent

Seminal vesicle involvement: Absent

Neurovascular bundle involvement: Absent

Pelvic adenopathy: Absent

Bone metastasis: Absent

Other findings: Sigmoid colon diverticulosis. Suspected prior groin
hernia repairs bilaterally, with a complex lesion partially included
along the left spermatic cord which likely has proteinaceous or
blood product elements.
IMPRESSION: 1. PI-RADS category 3 lesion of the left anterior and posterolateral
peripheral zone at the apex. Targeting data sent to [facility].
2. Benign prostatic hypertrophy.
3. Sigmoid colon diverticulosis.
4. Partially included structure along the left spermatic cord likely
has proteinaceous or blood product elements given the accentuated
internal T1 signal.

## 2022-05-31 DIAGNOSIS — L570 Actinic keratosis: Secondary | ICD-10-CM | POA: Diagnosis not present

## 2022-05-31 DIAGNOSIS — Z85828 Personal history of other malignant neoplasm of skin: Secondary | ICD-10-CM | POA: Diagnosis not present

## 2022-05-31 DIAGNOSIS — L821 Other seborrheic keratosis: Secondary | ICD-10-CM | POA: Diagnosis not present

## 2022-05-31 DIAGNOSIS — D485 Neoplasm of uncertain behavior of skin: Secondary | ICD-10-CM | POA: Diagnosis not present

## 2022-06-29 DIAGNOSIS — H5203 Hypermetropia, bilateral: Secondary | ICD-10-CM | POA: Diagnosis not present

## 2022-06-29 DIAGNOSIS — H2513 Age-related nuclear cataract, bilateral: Secondary | ICD-10-CM | POA: Diagnosis not present

## 2022-07-17 DIAGNOSIS — Z23 Encounter for immunization: Secondary | ICD-10-CM | POA: Diagnosis not present

## 2022-08-10 DIAGNOSIS — Z23 Encounter for immunization: Secondary | ICD-10-CM | POA: Diagnosis not present

## 2022-08-30 DIAGNOSIS — L821 Other seborrheic keratosis: Secondary | ICD-10-CM | POA: Diagnosis not present

## 2022-08-30 DIAGNOSIS — M7130 Other bursal cyst, unspecified site: Secondary | ICD-10-CM | POA: Diagnosis not present

## 2022-08-30 DIAGNOSIS — L280 Lichen simplex chronicus: Secondary | ICD-10-CM | POA: Diagnosis not present

## 2022-08-30 DIAGNOSIS — L812 Freckles: Secondary | ICD-10-CM | POA: Diagnosis not present

## 2022-08-30 DIAGNOSIS — D1801 Hemangioma of skin and subcutaneous tissue: Secondary | ICD-10-CM | POA: Diagnosis not present

## 2022-08-30 DIAGNOSIS — Z85828 Personal history of other malignant neoplasm of skin: Secondary | ICD-10-CM | POA: Diagnosis not present

## 2022-09-06 ENCOUNTER — Encounter: Payer: Medicare Other | Admitting: Internal Medicine

## 2022-09-20 DIAGNOSIS — R972 Elevated prostate specific antigen [PSA]: Secondary | ICD-10-CM | POA: Diagnosis not present

## 2022-09-27 DIAGNOSIS — R972 Elevated prostate specific antigen [PSA]: Secondary | ICD-10-CM | POA: Diagnosis not present

## 2022-11-21 ENCOUNTER — Encounter: Payer: Self-pay | Admitting: Internal Medicine

## 2022-11-21 ENCOUNTER — Ambulatory Visit (INDEPENDENT_AMBULATORY_CARE_PROVIDER_SITE_OTHER): Payer: Medicare Other | Admitting: Internal Medicine

## 2022-11-21 VITALS — BP 148/78 | HR 64 | Temp 97.9°F | Ht 70.0 in | Wt 207.2 lb

## 2022-11-21 DIAGNOSIS — Z0000 Encounter for general adult medical examination without abnormal findings: Secondary | ICD-10-CM

## 2022-11-21 DIAGNOSIS — R030 Elevated blood-pressure reading, without diagnosis of hypertension: Secondary | ICD-10-CM | POA: Diagnosis not present

## 2022-11-21 DIAGNOSIS — E782 Mixed hyperlipidemia: Secondary | ICD-10-CM | POA: Diagnosis not present

## 2022-11-21 DIAGNOSIS — R972 Elevated prostate specific antigen [PSA]: Secondary | ICD-10-CM

## 2022-11-21 LAB — CBC WITH DIFFERENTIAL/PLATELET
Basophils Absolute: 0.1 10*3/uL (ref 0.0–0.1)
Basophils Relative: 1.2 % (ref 0.0–3.0)
Eosinophils Absolute: 0.1 10*3/uL (ref 0.0–0.7)
Eosinophils Relative: 1.8 % (ref 0.0–5.0)
HCT: 49.6 % (ref 39.0–52.0)
Hemoglobin: 17.1 g/dL — ABNORMAL HIGH (ref 13.0–17.0)
Lymphocytes Relative: 33.3 % (ref 12.0–46.0)
Lymphs Abs: 1.6 10*3/uL (ref 0.7–4.0)
MCHC: 34.5 g/dL (ref 30.0–36.0)
MCV: 93.6 fl (ref 78.0–100.0)
Monocytes Absolute: 0.5 10*3/uL (ref 0.1–1.0)
Monocytes Relative: 10.6 % (ref 3.0–12.0)
Neutro Abs: 2.5 10*3/uL (ref 1.4–7.7)
Neutrophils Relative %: 53.1 % (ref 43.0–77.0)
Platelets: 216 10*3/uL (ref 150.0–400.0)
RBC: 5.3 Mil/uL (ref 4.22–5.81)
RDW: 13.2 % (ref 11.5–15.5)
WBC: 4.7 10*3/uL (ref 4.0–10.5)

## 2022-11-21 LAB — COMPREHENSIVE METABOLIC PANEL
ALT: 30 U/L (ref 0–53)
AST: 26 U/L (ref 0–37)
Albumin: 4.5 g/dL (ref 3.5–5.2)
Alkaline Phosphatase: 50 U/L (ref 39–117)
BUN: 17 mg/dL (ref 6–23)
CO2: 27 mEq/L (ref 19–32)
Calcium: 9.5 mg/dL (ref 8.4–10.5)
Chloride: 102 mEq/L (ref 96–112)
Creatinine, Ser: 1.01 mg/dL (ref 0.40–1.50)
GFR: 74.64 mL/min (ref >60.00)
Glucose, Bld: 93 mg/dL (ref 70–99)
Potassium: 4.5 mEq/L (ref 3.5–5.1)
Sodium: 138 mEq/L (ref 135–145)
Total Bilirubin: 0.6 mg/dL (ref 0.2–1.2)
Total Protein: 7.6 g/dL (ref 6.0–8.3)

## 2022-11-21 LAB — LIPID PANEL
Cholesterol: 193 mg/dL (ref 0–200)
HDL: 43 mg/dL (ref >39.00)
LDL Cholesterol: 121 mg/dL — ABNORMAL HIGH (ref 0–99)
NonHDL: 149.53
Total CHOL/HDL Ratio: 4
Triglycerides: 143 mg/dL (ref 0.0–149.0)
VLDL: 28.6 mg/dL (ref 0.0–40.0)

## 2022-11-21 NOTE — Progress Notes (Addendum)
Established Patient Office Visit     CC/Reason for Visit: Subsequent Medicare wellness visit and yearly follow-up chronic medical conditions  HPI: Andrew Mcmahon is a 72 y.o. male who is coming in today for the above mentioned reasons. Past Medical History is significant for: Hyperlipidemia, GERD, history of prostatitis and a diagnosis of cluster headaches.  He has routine eye and dental care, no perceived hearing difficulties.  He is overdue for RSV vaccine but all other immunizations are up-to-date.  He had a normal Cologuard in August 2021.  He has had issues over the years with an elevated PSA.  He has had 2 negative prostate biopsies.  His urologist is Dr. Alinda Money.  He last saw him in November, at that time his PSA was 7.1.  He has a follow-up appointment in February.  Since I last saw him he suffered a fall while hiking.  He suffered an injury to his right fourth finger that culminated in a mallet finger.  He has been seen regularly by orthopedics and also did occupational therapy.  2 in office blood pressures are noted to be elevated today.  He has also had high blood pressure recently at other office visits.  He has not had cluster headaches in over a year.   Past Medical/Surgical History: Past Medical History:  Diagnosis Date   Back pain    low, hx radiculopathy, saw ortho in the past   Closed right ankle fracture 09/2015   per pt due to fall while hiking   Deviated nasal septum    GERD (gastroesophageal reflux disease)    History of kidney stones    kidney stones - h/o    Non-melanoma skin cancer    hx mohs on R face   Panic attack    PONV (postoperative nausea and vomiting)    Right inguinal hernia    Wears glasses     Past Surgical History:  Procedure Laterality Date   COLONOSCOPY     INGUINAL HERNIA REPAIR Right 11/29/2016   Procedure: LAPAROSCOPIC RIGHT  INGUINAL HERNIA;  Surgeon: Coralie Keens, MD;  Location: [place];  Service: General;  Laterality: Right;    INSERTION OF MESH Right 11/29/2016   Procedure: INSERTION OF MESH;  Surgeon: Coralie Keens, MD;  Location: [place];  Service: General;  Laterality: Right;   MASS EXCISION  08/08/2012   Procedure: MINOR EXCISION OF MASS;  Surgeon: Cammie Sickle., MD;  Location: [place];  Service: Orthopedics;  Laterality: Right;  Minor Excision of Foreign Body   MOHS SURGERY     NASAL SEPTUM SURGERY     WISDOM TOOTH EXTRACTION  1980   XI ROBOTIC ASSISTED INGUINAL HERNIA REPAIR WITH MESH Left 11/09/2020   Procedure: ROBOTIC LEFT INGUINAL HERNIA WITH MESH;  Surgeon: Ralene Ok, MD;  Location: [place];  Service: General;  Laterality: Left;    Social History:  reports that he has never smoked. He has never been exposed to tobacco smoke. He has never used smokeless tobacco. He reports current alcohol use of about 8.0 - 10.0 standard drinks of alcohol per week. He reports that he does not use drugs.  Allergies: Allergies  Allergen Reactions   No Known Allergies     Family History:  Family History  Problem Relation Age of Onset   Liver disease Other    Multiple myeloma Father        died at 17      Current Outpatient Medications:  ALPRAZolam (XANAX) 0.25 MG tablet, Take 1 tablet (0.25 mg total) by mouth daily as needed for anxiety., Disp: 30 tablet, Rfl: 0   COLLAGEN PO, Take 1 Scoop by mouth daily., Disp: , Rfl:    esomeprazole (NEXIUM) 20 MG capsule, Take 20 mg by mouth every other day., Disp: , Rfl:    fish oil-omega-3 fatty acids 1000 MG capsule, Take 1 g by mouth daily., Disp: , Rfl:    Probiotic Product (PROBIOTIC PO), Take 1 capsule by mouth daily. , Disp: , Rfl:    VITAMIN D PO, Take 400 Units by mouth daily., Disp: , Rfl:   Review of Systems:  Negative unless indicated in HPI.   Physical Exam: Vitals:   11/21/22 0834 11/21/22 0839  BP: (!) 160/80 (!) 148/78  Pulse: 64   Temp: 97.9 F (36.6 C)   TempSrc: Oral   SpO2: 97%   Weight: 207 lb 3.2 oz (94 kg)    Height: '5\' 10"'$  (1.778 m)     Body mass index is 29.73 kg/m.   Physical Exam Vitals reviewed.  Constitutional:      General: He is not in acute distress.    Appearance: Normal appearance. He is not ill-appearing, toxic-appearing or diaphoretic.  HENT:     Head: Normocephalic.     Right Ear: Tympanic membrane, ear canal and external ear normal. There is no impacted cerumen.     Left Ear: Tympanic membrane, ear canal and external ear normal. There is no impacted cerumen.     Nose: Nose normal.     Mouth/Throat:     Mouth: Mucous membranes are moist.     Pharynx: Oropharynx is clear. No oropharyngeal exudate or posterior oropharyngeal erythema.  Eyes:     General: No scleral icterus.       Right eye: No discharge.        Left eye: No discharge.     Conjunctiva/sclera: Conjunctivae normal.     Pupils: Pupils are equal, round, and reactive to light.  Neck:     Vascular: No carotid bruit.  Cardiovascular:     Rate and Rhythm: Normal rate and regular rhythm.     Pulses: Normal pulses.     Heart sounds: Normal heart sounds.  Pulmonary:     Effort: Pulmonary effort is normal. No respiratory distress.     Breath sounds: Normal breath sounds.  Abdominal:     General: Abdomen is flat. Bowel sounds are normal.     Palpations: Abdomen is soft.  Musculoskeletal:        General: Normal range of motion.     Cervical back: Normal range of motion.  Skin:    General: Skin is warm and dry.     Capillary Refill: Capillary refill takes less than 2 seconds.  Neurological:     General: No focal deficit present.     Mental Status: He is alert and oriented to person, place, and time. Mental status is at baseline.  Psychiatric:        Mood and Affect: Mood normal.        Behavior: Behavior normal.        Thought Content: Thought content normal.        Judgment: Judgment normal.      Subsequent Medicare wellness visit   1. Risk factors, based on past  M,S,F -cardiovascular disease risk  factors include age, gender, hyperlipidemia and elevated blood pressure   2.  Physical activities: Walks and swims frequently  3.  Depression/mood: Stable, not depressed   4.  Hearing: No perceived issues   5.  ADL's: Independent in all ADLs   6.  Fall risk: Moderate fall risk due to 1 fall this past year   7.  Home safety: No problems identified   8.  Height weight, and visual acuity: height and weight as above, vision:  Vision Screening   Right eye Left eye Both eyes  Without correction     With correction '20/20 20/20 20/20 '$     9.  Counseling: Advised to update immunizations   10. Lab orders based on risk factors: Laboratory update will be reviewed   11. Referral : None today   12. Care plan: Follow-up in 6 to 8 weeks for blood pressure monitoring   13. Cognitive assessment: No cognitive impairment   14. Screening: Patient provided with a written and personalized 5-10 year screening schedule in the AVS. yes   15. Provider List Update: PCP, urology  16. Advance Directives: Full code   17. Opioids: Patient is not on any opioid prescriptions and has no risk factors for a substance use disorder.   [place] Office Visit from 11/21/2022 in [place] at [place]  PHQ-9 Total Score 1          11/09/2020    8:33 AM 02/02/2021    1:05 PM 03/15/2021    9:06 AM 11/03/2021    3:37 PM 11/21/2022    9:17 AM  Fall Risk  Falls in the past year?   0  1  Was there an injury with Fall?   0  1  Fall Risk Category Calculator   0  2  Fall Risk Category (Retired)   Low    (RETIRED) Patient Fall Risk Level Low fall risk Low fall risk  Low fall risk   Patient at Risk for Falls Due to     No Fall Risks  Fall risk Follow up     Falls evaluation completed    Impression and Plan:  Medicare annual wellness visit, subsequent  Elevated BP without diagnosis of hypertension  Mixed hyperlipidemia - Plan: CBC with Differential/Platelet, Comprehensive metabolic  panel, Lipid panel, Lipid panel, Comprehensive metabolic panel, CBC with Differential/Platelet  Elevated PSA  -Recommend routine eye and dental care. -Immunizations: Advised to get RSV at pharmacy, all other immunizations are up-to-date -Healthy lifestyle discussed in detail. -Labs to be updated today. -Colon cancer screening: Normal Cologuard in August/2021 -Breast cancer screening: Not applicable -Cervical cancer screening: Not applicable -Lung cancer screening: Not applicable -Prostate cancer screening: Follows with urology routinely due to an elevated PSA, last seen in November. -DEXA: Not applicable  -Blood pressure is noted to be elevated at today's visit.  He will do ambulatory blood pressure monitoring and return in 6 to 8 weeks for follow-up.  His blood pressure has been noted to be elevated in the past as well.     Lelon Frohlich, MD [facility] at [facility]

## 2022-11-22 ENCOUNTER — Other Ambulatory Visit: Payer: Self-pay | Admitting: Internal Medicine

## 2022-11-22 DIAGNOSIS — E782 Mixed hyperlipidemia: Secondary | ICD-10-CM

## 2022-11-22 MED ORDER — ATORVASTATIN CALCIUM 40 MG PO TABS: 40.0000 mg | ORAL_TABLET | Freq: Every day | ORAL | 1 refills | Status: DC

## 2022-12-26 ENCOUNTER — Ambulatory Visit (INDEPENDENT_AMBULATORY_CARE_PROVIDER_SITE_OTHER): Payer: Medicare Other | Admitting: Internal Medicine

## 2022-12-26 VITALS — BP 130/84 | HR 70 | Temp 98.5°F | Wt 212.5 lb

## 2022-12-26 DIAGNOSIS — J029 Acute pharyngitis, unspecified: Secondary | ICD-10-CM

## 2022-12-26 DIAGNOSIS — Z8349 Family history of other endocrine, nutritional and metabolic diseases: Secondary | ICD-10-CM

## 2022-12-26 NOTE — Progress Notes (Signed)
Established Patient Office Visit     CC/Reason for Visit: Sore throat, update family history  HPI: Andrew Mcmahon is a 72 y.o. male who is coming in today for the above mentioned reasons.  For the past week he has been experiencing a sore throat.  He does not recall any specific URI symptoms or sick contacts, however he does admit to having some postnasal drip.  He also wants me to know that his brother was recently diagnosed with hemochromatosis.  He was told it was hereditary and wants to know if he needs to be tested for it.   Past Medical/Surgical History: Past Medical History:  Diagnosis Date   Back pain    low, hx radiculopathy, saw ortho in the past   Closed right ankle fracture 09/2015   per pt due to fall while hiking   Deviated nasal septum    GERD (gastroesophageal reflux disease)    History of kidney stones    kidney stones - h/o    Non-melanoma skin cancer    hx mohs on R face   Panic attack    PONV (postoperative nausea and vomiting)    Right inguinal hernia    Wears glasses     Past Surgical History:  Procedure Laterality Date   COLONOSCOPY     INGUINAL HERNIA REPAIR Right 11/29/2016   Procedure: LAPAROSCOPIC RIGHT  INGUINAL HERNIA;  Surgeon: Coralie Keens, MD;  Location: [place];  Service: General;  Laterality: Right;   INSERTION OF MESH Right 11/29/2016   Procedure: INSERTION OF MESH;  Surgeon: Coralie Keens, MD;  Location: [place];  Service: General;  Laterality: Right;   MASS EXCISION  08/08/2012   Procedure: MINOR EXCISION OF MASS;  Surgeon: Cammie Sickle., MD;  Location: [place];  Service: Orthopedics;  Laterality: Right;  Minor Excision of Foreign Body   MOHS SURGERY     NASAL SEPTUM SURGERY     WISDOM TOOTH EXTRACTION  1980   XI ROBOTIC ASSISTED INGUINAL HERNIA REPAIR WITH MESH Left 11/09/2020   Procedure: ROBOTIC LEFT INGUINAL HERNIA WITH MESH;  Surgeon: Ralene Ok, MD;  Location: [place];  Service: General;   Laterality: Left;    Social History:  reports that he has never smoked. He has never been exposed to tobacco smoke. He has never used smokeless tobacco. He reports current alcohol use of about 8.0 - 10.0 standard drinks of alcohol per week. He reports that he does not use drugs.  Allergies: Allergies  Allergen Reactions   No Known Allergies     Family History:  Family History  Problem Relation Age of Onset   Liver disease Other    Multiple myeloma Father        died at 9      Current Outpatient Medications:    ALPRAZolam (XANAX) 0.25 MG tablet, Take 1 tablet (0.25 mg total) by mouth daily as needed for anxiety., Disp: 30 tablet, Rfl: 0   atorvastatin (LIPITOR) 40 MG tablet, Take 1 tablet (40 mg total) by mouth daily., Disp: 90 tablet, Rfl: 1   COLLAGEN PO, Take 1 Scoop by mouth daily., Disp: , Rfl:    esomeprazole (NEXIUM) 20 MG capsule, Take 20 mg by mouth every other day., Disp: , Rfl:    fish oil-omega-3 fatty acids 1000 MG capsule, Take 1 g by mouth daily., Disp: , Rfl:    Probiotic Product (PROBIOTIC PO), Take 1 capsule by mouth daily. , Disp: , Rfl:  VITAMIN D PO, Take 400 Units by mouth daily., Disp: , Rfl:   Review of Systems:  Negative unless indicated in HPI.   Physical Exam: Vitals:   12/26/22 1610  BP: 130/84  Pulse: 70  Temp: 98.5 F (36.9 C)  TempSrc: Oral  SpO2: 98%  Weight: 212 lb 8 oz (96.4 kg)    Body mass index is 30.49 kg/m.   Physical Exam Vitals reviewed.  Constitutional:      Appearance: Normal appearance.  HENT:     Head: Normocephalic and atraumatic.     Right Ear: Tympanic membrane, ear canal and external ear normal.     Left Ear: Tympanic membrane, ear canal and external ear normal.     Mouth/Throat:     Mouth: Mucous membranes are moist.     Pharynx: Posterior oropharyngeal erythema present.  Eyes:     Conjunctiva/sclera: Conjunctivae normal.     Pupils: Pupils are equal, round, and reactive to light.  Cardiovascular:      Rate and Rhythm: Normal rate and regular rhythm.  Pulmonary:     Effort: Pulmonary effort is normal.     Breath sounds: Normal breath sounds.  Skin:    General: Skin is warm and dry.  Neurological:     General: No focal deficit present.     Mental Status: He is alert and oriented to person, place, and time.  Psychiatric:        Mood and Affect: Mood normal.        Behavior: Behavior normal.        Thought Content: Thought content normal.        Judgment: Judgment normal.      Impression and Plan:  Sore throat  Family history of hemochromatosis - Plan: IBC + Ferritin  -Sore throat likely related to postnasal drainage.  Have advised combination of guaifenesin and antihistamine. -In regards to his brother being diagnosed with hemochromatosis, we will plan on checking iron studies with next blood draw.  Time spent:23 minutes reviewing chart, interviewing and examining patient and formulating plan of care.     Lelon Frohlich, MD [facility] at [facility]

## 2022-12-27 DIAGNOSIS — R972 Elevated prostate specific antigen [PSA]: Secondary | ICD-10-CM | POA: Diagnosis not present

## 2023-01-02 ENCOUNTER — Other Ambulatory Visit: Payer: Medicare Other

## 2023-02-06 ENCOUNTER — Ambulatory Visit (INDEPENDENT_AMBULATORY_CARE_PROVIDER_SITE_OTHER): Payer: Medicare Other | Admitting: Physician Assistant

## 2023-02-06 ENCOUNTER — Encounter: Payer: Self-pay | Admitting: Physician Assistant

## 2023-02-06 VITALS — BP 140/80 | HR 72 | Temp 97.7°F | Ht 70.0 in | Wt 213.0 lb

## 2023-02-06 DIAGNOSIS — M109 Gout, unspecified: Secondary | ICD-10-CM

## 2023-02-06 MED ORDER — PREDNISONE 50 MG PO TABS: ORAL_TABLET | ORAL | 0 refills | Status: DC

## 2023-02-06 NOTE — Progress Notes (Signed)
Andrew Mcmahon is a 72 y.o. male here for a new problem.  History of Present Illness:   Chief Complaint  Patient presents with   Gout    Pt c/o gout flare right great toe, started last week, toe is red swollen and painful. Has been taking Advil.    HPI  Per chart, had gout flare of R great toe in Aug 2014. Received medrol dose pack.  Sx started last week. Has had this in the past but typically does not need to seek care for this and advil usually helps. Taking 200 mg ibuprofen at least TID without any relief Denies: fevers, chills, weakness, numbness, tingling  Drinks ETOH but denies any recent significant intake    Past Medical History:  Diagnosis Date   Back pain    low, hx radiculopathy, saw ortho in the past   Closed right ankle fracture 09/2015   per pt due to fall while hiking   Deviated nasal septum    GERD (gastroesophageal reflux disease)    History of kidney stones    kidney stones - h/o    Non-melanoma skin cancer    hx mohs on R face   Panic attack    PONV (postoperative nausea and vomiting)    Right inguinal hernia    Wears glasses      Social History   Tobacco Use   Smoking status: Never    Passive exposure: Never   Smokeless tobacco: Never   Tobacco comments:    parents smoked growing up; stopped when he was a teenager  Vaping Use   Vaping Use: Never used  Substance Use Topics   Alcohol use: Yes    Alcohol/week: 8.0 - 10.0 standard drinks of alcohol    Types: 8 - 10 Glasses of wine per week   Drug use: No    Past Surgical History:  Procedure Laterality Date   COLONOSCOPY     INGUINAL HERNIA REPAIR Right 11/29/2016   Procedure: LAPAROSCOPIC RIGHT  INGUINAL HERNIA;  Surgeon: Abigail Miyamoto, MD;  Location: [facility];  Service: General;  Laterality: Right;   INSERTION OF MESH Right 11/29/2016   Procedure: INSERTION OF MESH;  Surgeon: Abigail Miyamoto, MD;  Location: [facility];  Service: General;  Laterality: Right;   MASS EXCISION  08/08/2012    Procedure: MINOR EXCISION OF MASS;  Surgeon: Wyn Forster., MD;  Location: [facility];  Service: Orthopedics;  Laterality: Right;  Minor Excision of Foreign Body   MOHS SURGERY     NASAL SEPTUM SURGERY     WISDOM TOOTH EXTRACTION  1980   XI ROBOTIC ASSISTED INGUINAL HERNIA REPAIR WITH MESH Left 11/09/2020   Procedure: ROBOTIC LEFT INGUINAL HERNIA WITH MESH;  Surgeon: Axel Filler, MD;  Location: [facility];  Service: General;  Laterality: Left;    Family History  Problem Relation Age of Onset   Liver disease Other    Multiple myeloma Father        died at 69     Allergies  Allergen Reactions   No Known Allergies     Current Medications:   Current Outpatient Medications:    atorvastatin (LIPITOR) 40 MG tablet, Take 1 tablet (40 mg total) by mouth daily., Disp: 90 tablet, Rfl: 1   COLLAGEN PO, Take 1 Scoop by mouth daily., Disp: , Rfl:    esomeprazole (NEXIUM) 20 MG capsule, Take 20 mg by mouth every other day., Disp: , Rfl:    fish oil-omega-3 fatty  acids 1000 MG capsule, Take 1 g by mouth daily., Disp: , Rfl:    predniSONE (DELTASONE) 50 MG tablet, Take 1 tablet daily, Disp: 5 tablet, Rfl: 0   Probiotic Product (PROBIOTIC PO), Take 1 capsule by mouth daily. , Disp: , Rfl:    VITAMIN D PO, Take 2,500 Units by mouth daily., Disp: , Rfl:    Review of Systems:   ROS Negative unless otherwise specified per HPI.   Vitals:   Vitals:   02/06/23 1309  BP: (!) 150/80  Pulse: 72  Temp: 97.7 F (36.5 C)  TempSrc: Temporal  SpO2: 97%  Weight: 213 lb (96.6 kg)  Height: 5\' 10"  (1.778 m)     Body mass index is 30.56 kg/m.  Physical Exam:   Physical Exam Vitals and nursing note reviewed.  Constitutional:      Appearance: He is well-developed.  HENT:     Head: Normocephalic.  Eyes:     Conjunctiva/sclera: Conjunctivae normal.     Pupils: Pupils are equal, round, and reactive to light.  Cardiovascular:     Pulses:          Dorsalis pedis pulses are 2+  on the right side.       Posterior tibial pulses are 2+ on the right side.     Comments: Capillary refill adequate in right toes Pulmonary:     Effort: Pulmonary effort is normal.  Musculoskeletal:        General: Normal range of motion.     Cervical back: Normal range of motion.  Feet:     Comments: Tender and erythematous right MTP joint No significant warmth Mild swelling Skin:    General: Skin is warm and dry.  Neurological:     Mental Status: He is alert and oriented to person, place, and time.     Comments: Normal sensation to right toes  Psychiatric:        Behavior: Behavior normal.        Thought Content: Thought content normal.        Judgment: Judgment normal.       Assessment and Plan:   Acute gout involving toe of right foot, unspecified cause No red flags Neurovascularly intact on my exam Start prednisone 50 mg daily x 5 days Encouraged elevation If any lack of improvement or NEW symptoms, needs immediate follow-up  Recommend follow-up if    Jarold Motto, PA-C

## 2023-02-06 NOTE — Patient Instructions (Signed)
It was great to see you!  I suspect you have gout  Take 1 tablet daily of prednisone Avoid additional ibuprofen while on this medication May take tylenol if needed  If any fever, chills, worsening symptoms or lack of improvement - reach out ASAP   Take care,  Jarold Motto PA-C

## 2023-02-26 ENCOUNTER — Other Ambulatory Visit (INDEPENDENT_AMBULATORY_CARE_PROVIDER_SITE_OTHER): Payer: Medicare Other

## 2023-02-26 DIAGNOSIS — Z8349 Family history of other endocrine, nutritional and metabolic diseases: Secondary | ICD-10-CM

## 2023-02-26 LAB — IBC + FERRITIN
Ferritin: 131.7 ng/mL (ref 22.0–322.0)
Iron: 127 ug/dL (ref 42–165)
Saturation Ratios: 40.7 % (ref 20.0–50.0)
TIBC: 312.2 ug/dL (ref 250.0–450.0)
Transferrin: 223 mg/dL (ref 212.0–360.0)

## 2023-03-05 ENCOUNTER — Telehealth: Payer: Self-pay | Admitting: Internal Medicine

## 2023-03-05 DIAGNOSIS — E782 Mixed hyperlipidemia: Secondary | ICD-10-CM

## 2023-03-05 NOTE — Telephone Encounter (Signed)
Left message on machine returning patient's call 

## 2023-03-05 NOTE — Telephone Encounter (Signed)
Spoke with patient and reviewed lab results.  Patient states that he was supposed to have his cholesterol checked.  Lab ordered and appointment scheduled.

## 2023-03-05 NOTE — Telephone Encounter (Signed)
Pt called with questions regarding his results.  Please call Pt back at your earliest convenience.

## 2023-03-07 ENCOUNTER — Other Ambulatory Visit (INDEPENDENT_AMBULATORY_CARE_PROVIDER_SITE_OTHER): Payer: Medicare Other

## 2023-03-07 DIAGNOSIS — E782 Mixed hyperlipidemia: Secondary | ICD-10-CM

## 2023-03-07 LAB — LIPID PANEL
Cholesterol: 150 mg/dL (ref 0–200)
HDL: 41.4 mg/dL (ref >39.00)
LDL Cholesterol: 79 mg/dL (ref 0–99)
NonHDL: 108.33
Total CHOL/HDL Ratio: 4
Triglycerides: 145 mg/dL (ref 0.0–149.0)
VLDL: 29 mg/dL (ref 0.0–40.0)

## 2023-05-01 DIAGNOSIS — L570 Actinic keratosis: Secondary | ICD-10-CM | POA: Diagnosis not present

## 2023-05-01 DIAGNOSIS — Z85828 Personal history of other malignant neoplasm of skin: Secondary | ICD-10-CM | POA: Diagnosis not present

## 2023-05-01 DIAGNOSIS — L821 Other seborrheic keratosis: Secondary | ICD-10-CM | POA: Diagnosis not present

## 2023-05-18 DIAGNOSIS — R972 Elevated prostate specific antigen [PSA]: Secondary | ICD-10-CM | POA: Diagnosis not present

## 2023-05-20 ENCOUNTER — Other Ambulatory Visit: Payer: Self-pay | Admitting: Internal Medicine

## 2023-05-20 DIAGNOSIS — E782 Mixed hyperlipidemia: Secondary | ICD-10-CM

## 2023-05-25 DIAGNOSIS — R972 Elevated prostate specific antigen [PSA]: Secondary | ICD-10-CM | POA: Diagnosis not present

## 2023-05-28 ENCOUNTER — Other Ambulatory Visit: Payer: Self-pay | Admitting: Urology

## 2023-05-28 DIAGNOSIS — R972 Elevated prostate specific antigen [PSA]: Secondary | ICD-10-CM

## 2023-07-05 DIAGNOSIS — H2513 Age-related nuclear cataract, bilateral: Secondary | ICD-10-CM | POA: Diagnosis not present

## 2023-07-05 DIAGNOSIS — H524 Presbyopia: Secondary | ICD-10-CM | POA: Diagnosis not present

## 2023-07-09 ENCOUNTER — Telehealth: Payer: Self-pay | Admitting: Internal Medicine

## 2023-07-09 DIAGNOSIS — Z1211 Encounter for screening for malignant neoplasm of colon: Secondary | ICD-10-CM

## 2023-07-09 NOTE — Telephone Encounter (Signed)
Okay to order?

## 2023-07-09 NOTE — Telephone Encounter (Signed)
Cologuard ordered

## 2023-07-09 NOTE — Telephone Encounter (Signed)
Requesting order for cologuard

## 2023-07-14 ENCOUNTER — Ambulatory Visit
Admission: RE | Admit: 2023-07-14 | Discharge: 2023-07-14 | Disposition: A | Discharge status: Home / Self Care | Payer: Medicare Other | Source: Ambulatory Visit | Attending: Urology | Admitting: Urology

## 2023-07-14 DIAGNOSIS — R972 Elevated prostate specific antigen [PSA]: Secondary | ICD-10-CM | POA: Diagnosis not present

## 2023-07-14 MED ORDER — GADOPICLENOL 0.5 MMOL/ML IV SOLN
9.0000 mL | Freq: Once | INTRAVENOUS | Status: AC | PRN
  Administered 2023-07-14: 9 mL via INTRAVENOUS

## 2023-08-02 ENCOUNTER — Ambulatory Visit: Payer: Medicare Other | Admitting: Adult Health

## 2023-08-02 ENCOUNTER — Encounter: Payer: Self-pay | Admitting: Adult Health

## 2023-08-02 VITALS — BP 130/90 | HR 70 | Temp 98.4°F | Ht 70.0 in | Wt 213.0 lb

## 2023-08-02 DIAGNOSIS — T148XXA Other injury of unspecified body region, initial encounter: Secondary | ICD-10-CM

## 2023-08-02 NOTE — Progress Notes (Signed)
Subjective:    Patient ID: Andrew Mcmahon, male    DOB: [birth year], 72 y.o.   MRN: [id number]  HPI 72 year old male who  has a past medical history of Back pain, Closed right ankle fracture (09/2015), Deviated nasal septum, GERD (gastroesophageal reflux disease), History of kidney stones, Non-melanoma skin cancer, Panic attack, PONV (postoperative nausea and vomiting), Right inguinal hernia, and Wears glasses.  He is a patient of Dr. Ardyth Harps who I am seeing today for an acute issue.  He believes that he was bitten by some type of insect and now has a bruise on the top of his right hand.  He denies pain, discomfort, itching, loss of grip strength, or swelling.  Bruise has improved over the last 24 hours.  He denies trauma or aggravating injury.   Review of Systems See HPI   Past Medical History:  Diagnosis Date   Back pain    low, hx radiculopathy, saw ortho in the past   Closed right ankle fracture 09/2015   per pt due to fall while hiking   Deviated nasal septum    GERD (gastroesophageal reflux disease)    History of kidney stones    kidney stones - h/o    Non-melanoma skin cancer    hx mohs on R face   Panic attack    PONV (postoperative nausea and vomiting)    Right inguinal hernia    Wears glasses     Social History   Socioeconomic History   Marital status: Married    Spouse name: Not on file   Number of children: 0   Years of education: Not on file   Highest education level: Bachelor's degree (e.g., BA, AB, BS)  Occupational History   Occupation: Research scientist (medical)  Tobacco Use   Smoking status: Never    Passive exposure: Never   Smokeless tobacco: Never   Tobacco comments:    parents smoked growing up; stopped when he was a teenager  Vaping Use   Vaping status: Never Used  Substance and Sexual Activity   Alcohol use: Yes    Alcohol/week: 8.0 - 10.0 standard drinks of alcohol    Types: 8 - 10 Glasses of wine per week   Drug use: No   Sexual activity: Yes  Other  Topics Concern   Not on file  Social History Narrative   Not on file   Social Determinants of Health   Financial Resource Strain: Not on file  Food Insecurity: No Food Insecurity (12/26/2022)   Hunger Vital Sign    Worried About Running Out of Food in the Last Year: Never true    Ran Out of Food in the Last Year: Never true  Transportation Needs: No Transportation Needs (12/26/2022)   PRAPARE - Administrator, Civil Service (Medical): No    Lack of Transportation (Non-Medical): No  Physical Activity: Sufficiently Active (12/26/2022)   Exercise Vital Sign    Days of Exercise per Week: 6 days    Minutes of Exercise per Session: 40 min  Stress: No Stress Concern Present (12/26/2022)   [employer] of Occupational Health - Occupational Stress Questionnaire    Feeling of Stress : Not at all  Social Connections: Unknown (12/26/2022)   Social Connection and Isolation Panel [NHANES]    Frequency of Communication with Friends and Family: Patient declined    Frequency of Social Gatherings with Friends and Family: Once a week    Attends Religious Services: Patient declined  Active Member of Clubs or Organizations: Yes    Attends Banker Meetings: More than 4 times per year    Marital Status: Married  Catering manager Violence: Not on file    Past Surgical History:  Procedure Laterality Date   COLONOSCOPY     INGUINAL HERNIA REPAIR Right 11/29/2016   Procedure: LAPAROSCOPIC RIGHT  INGUINAL HERNIA;  Surgeon: Abigail Miyamoto, MD;  Location: [facility];  Service: General;  Laterality: Right;   INSERTION OF MESH Right 11/29/2016   Procedure: INSERTION OF MESH;  Surgeon: Abigail Miyamoto, MD;  Location: [facility];  Service: General;  Laterality: Right;   MASS EXCISION  08/08/2012   Procedure: MINOR EXCISION OF MASS;  Surgeon: Wyn Forster., MD;  Location: [facility];  Service: Orthopedics;  Laterality: Right;  Minor Excision of Foreign Body   MOHS  SURGERY     NASAL SEPTUM SURGERY     WISDOM TOOTH EXTRACTION  1980   XI ROBOTIC ASSISTED INGUINAL HERNIA REPAIR WITH MESH Left 11/09/2020   Procedure: ROBOTIC LEFT INGUINAL HERNIA WITH MESH;  Surgeon: Axel Filler, MD;  Location: [facility];  Service: General;  Laterality: Left;    Family History  Problem Relation Age of Onset   Liver disease Other    Multiple myeloma Father        died at 51     Allergies  Allergen Reactions   No Known Allergies     Current Outpatient Medications on File Prior to Visit  Medication Sig Dispense Refill   atorvastatin (LIPITOR) 40 MG tablet TAKE 1 TABLET BY MOUTH EVERY DAY 90 tablet 1   COLLAGEN PO Take 1 Scoop by mouth daily.     esomeprazole (NEXIUM) 20 MG capsule Take 20 mg by mouth every other day.     fish oil-omega-3 fatty acids 1000 MG capsule Take 1 g by mouth daily.     predniSONE (DELTASONE) 50 MG tablet Take 1 tablet daily 5 tablet 0   Probiotic Product (PROBIOTIC PO) Take 1 capsule by mouth daily.      VITAMIN D PO Take 2,500 Units by mouth daily.     No current facility-administered medications on file prior to visit.    BP (!) 130/90   Pulse 70   Temp 98.4 F (36.9 C) (Oral)   Ht 5\' 10"  (1.778 m)   Wt 213 lb (96.6 kg)   SpO2 96%   BMI 30.56 kg/m       Objective:   Physical Exam Vitals and nursing note reviewed.  Constitutional:      Appearance: Normal appearance.  Musculoskeletal:        General: No swelling, tenderness or deformity.  Skin:    General: Skin is dry.     Findings: Bruising present.     Comments: He does have circular bruise on the dorsal aspect of his right hand below his thumb.  Small nodular center.  There does not seem to be any erythema, warmth, or swelling noted  Neurological:     General: No focal deficit present.     Mental Status: He is alert and oriented to person, place, and time.  Psychiatric:        Mood and Affect: Mood normal.        Behavior: Behavior normal.        Thought Content:  Thought content normal.        Judgment: Judgment normal.  Assessment & Plan:  1. Bruising -Unknown mechanism of injury.  Encouraged watchful waiting and bruise should resolve over the next few days.  Follow-up in the office if his symptoms change  Shirline Frees, NP

## 2023-08-07 DIAGNOSIS — Z23 Encounter for immunization: Secondary | ICD-10-CM | POA: Diagnosis not present

## 2023-08-29 NOTE — Addendum Note (Signed)
Addended by: Kern Reap B on: 08/29/2023 04:00 PM   Modules accepted: Orders

## 2023-08-29 NOTE — Telephone Encounter (Signed)
Pt called to say he never received this order and would like to request another.

## 2023-08-29 NOTE — Telephone Encounter (Signed)
Cologuard re-ordered.

## 2023-09-18 DIAGNOSIS — Z23 Encounter for immunization: Secondary | ICD-10-CM | POA: Diagnosis not present

## 2023-09-24 LAB — COLOGUARD

## 2023-09-25 NOTE — Addendum Note (Signed)
Addended by: Kern Reap B on: 09/25/2023 11:01 AM   Modules accepted: Orders

## 2023-09-25 NOTE — Telephone Encounter (Signed)
Patient is aware and cologuard was ordered.

## 2023-10-02 DIAGNOSIS — Z1211 Encounter for screening for malignant neoplasm of colon: Secondary | ICD-10-CM | POA: Diagnosis not present

## 2023-10-09 DIAGNOSIS — D1801 Hemangioma of skin and subcutaneous tissue: Secondary | ICD-10-CM | POA: Diagnosis not present

## 2023-10-09 DIAGNOSIS — Z85828 Personal history of other malignant neoplasm of skin: Secondary | ICD-10-CM | POA: Diagnosis not present

## 2023-10-09 DIAGNOSIS — M7130 Other bursal cyst, unspecified site: Secondary | ICD-10-CM | POA: Diagnosis not present

## 2023-10-09 DIAGNOSIS — I788 Other diseases of capillaries: Secondary | ICD-10-CM | POA: Diagnosis not present

## 2023-10-09 DIAGNOSIS — L821 Other seborrheic keratosis: Secondary | ICD-10-CM | POA: Diagnosis not present

## 2023-10-09 DIAGNOSIS — L308 Other specified dermatitis: Secondary | ICD-10-CM | POA: Diagnosis not present

## 2023-10-09 DIAGNOSIS — L812 Freckles: Secondary | ICD-10-CM | POA: Diagnosis not present

## 2023-10-09 LAB — COLOGUARD: COLOGUARD: NEGATIVE

## 2023-11-28 DIAGNOSIS — R972 Elevated prostate specific antigen [PSA]: Secondary | ICD-10-CM | POA: Diagnosis not present

## 2023-11-28 LAB — PSA: PSA: 6.61

## 2023-12-05 ENCOUNTER — Ambulatory Visit: Payer: Medicare Other

## 2023-12-05 VITALS — BP 122/64 | HR 63 | Temp 97.9°F | Ht 70.0 in | Wt 211.6 lb

## 2023-12-05 DIAGNOSIS — Z0000 Encounter for general adult medical examination without abnormal findings: Secondary | ICD-10-CM | POA: Diagnosis not present

## 2023-12-05 DIAGNOSIS — R972 Elevated prostate specific antigen [PSA]: Secondary | ICD-10-CM | POA: Diagnosis not present

## 2023-12-05 NOTE — Patient Instructions (Addendum)
 Mr. Andrew Mcmahon , Thank you for taking time to come for your Medicare Wellness Visit. I appreciate your ongoing commitment to your health goals. Please review the following plan we discussed and let me know if I can assist you in the future.   Referrals/Orders/Follow-Ups/Clinician Recommendations:   This is a list of the screening recommended for you and due dates:  Health Maintenance  Topic Date Due   DTaP/Tdap/Td vaccine (3 - Td or Tdap) 02/06/2024   Medicare Annual Wellness Visit  12/04/2024   Cologuard (Stool DNA test)  10/01/2026   Pneumonia Vaccine  Completed   Flu Shot  Completed   COVID-19 Vaccine  Completed   Hepatitis C Screening  Completed   Zoster (Shingles) Vaccine  Completed   HPV Vaccine  Aged Out    Advanced directives: (Declined) Advance directive discussed with you today. Even though you declined this today, please call our office should you change your mind, and we can give you the proper paperwork for you to fill out.  Next Medicare Annual Wellness Visit scheduled for next year: Yes

## 2023-12-05 NOTE — Progress Notes (Signed)
 Subjective:   Andrew Mcmahon is a 73 y.o. male who presents for Medicare Annual/Subsequent preventive examination.  Visit Complete: In person   Cardiac Risk Factors include: advanced age (>30men, >24 women);male gender     Objective:    Today's Vitals   12/05/23 0959  BP: 122/64  Pulse: 63  Temp: 97.9 F (36.6 C)  TempSrc: Oral  SpO2: 98%  Weight: 211 lb 9.6 oz (96 kg)  Height: 5' 10 (1.778 m)   Body mass index is 30.36 kg/m.     12/05/2023   10:22 AM 11/03/2021    3:37 PM 02/02/2021    1:05 PM 01/29/2018    4:22 PM 11/23/2016    1:44 PM 12/25/2014    4:22 PM  Advanced Directives  Does Patient Have a Medical Advance Directive? No No No Yes No No  Would patient like information on creating a medical advance directive? No - Patient declined No - Patient declined No - Patient declined   No - patient declined information    Current Medications (verified) Outpatient Encounter Medications as of 12/05/2023  Medication Sig   atorvastatin  (LIPITOR) 40 MG tablet TAKE 1 TABLET BY MOUTH EVERY DAY   COLLAGEN PO Take 1 Scoop by mouth daily.   esomeprazole (NEXIUM) 20 MG capsule Take 20 mg by mouth every other day.   fish oil-omega-3 fatty acids 1000 MG capsule Take 1 g by mouth daily.   predniSONE  (DELTASONE ) 50 MG tablet Take 1 tablet daily (Patient not taking: Reported on 12/05/2023)   Probiotic Product (PROBIOTIC PO) Take 1 capsule by mouth daily.    VITAMIN D  PO Take 2,500 Units by mouth daily.   No facility-administered encounter medications on file as of 12/05/2023.    Allergies (verified) No known allergies   History: Past Medical History:  Diagnosis Date   Back pain    low, hx radiculopathy, saw ortho in the past   Closed right ankle fracture 09/2015   per pt due to fall while hiking   Deviated nasal septum    GERD (gastroesophageal reflux disease)    History of kidney stones    kidney stones - h/o    Non-melanoma skin cancer    hx mohs on R face   Panic attack     PONV (postoperative nausea and vomiting)    Right inguinal hernia    Wears glasses    Past Surgical History:  Procedure Laterality Date   COLONOSCOPY     INGUINAL HERNIA REPAIR Right 11/29/2016   Procedure: LAPAROSCOPIC RIGHT  INGUINAL HERNIA;  Surgeon: Vicenta Poli, MD;  Location: [facility] OR;  Service: General;  Laterality: Right;   INSERTION OF MESH Right 11/29/2016   Procedure: INSERTION OF MESH;  Surgeon: Vicenta Poli, MD;  Location: [facility] OR;  Service: General;  Laterality: Right;   MASS EXCISION  08/08/2012   Procedure: MINOR EXCISION OF MASS;  Surgeon: Lamar LULLA Leonor Mickey., MD;  Location: [facility];  Service: Orthopedics;  Laterality: Right;  Minor Excision of Foreign Body   MOHS SURGERY     NASAL SEPTUM SURGERY     WISDOM TOOTH EXTRACTION  1980   XI ROBOTIC ASSISTED INGUINAL HERNIA REPAIR WITH MESH Left 11/09/2020   Procedure: ROBOTIC LEFT INGUINAL HERNIA WITH MESH;  Surgeon: Rubin Calamity, MD;  Location: [facility] OR;  Service: General;  Laterality: Left;   Family History  Problem Relation Age of Onset   Liver disease Other    Multiple myeloma Father  died at 22    Social History   Socioeconomic History   Marital status: Married    Spouse name: Not on file   Number of children: 0   Years of education: Not on file   Highest education level: Bachelor's degree (e.g., BA, AB, BS)  Occupational History   Occupation: research scientist (medical)  Tobacco Use   Smoking status: Never    Passive exposure: Never   Smokeless tobacco: Never   Tobacco comments:    parents smoked growing up; stopped when he was a teenager  Vaping Use   Vaping status: Never Used  Substance and Sexual Activity   Alcohol use: Yes    Alcohol/week: 8.0 - 10.0 standard drinks of alcohol    Types: 8 - 10 Glasses of wine per week   Drug use: No   Sexual activity: Yes  Other Topics Concern   Not on file  Social History Narrative   Not on file   Social Drivers of Health   Financial Resource  Strain: Low Risk  (12/05/2023)   Overall Financial Resource Strain (CARDIA)    Difficulty of Paying Living Expenses: Not hard at all  Food Insecurity: No Food Insecurity (12/05/2023)   Hunger Vital Sign    Worried About Running Out of Food in the Last Year: Never true    Ran Out of Food in the Last Year: Never true  Transportation Needs: No Transportation Needs (12/05/2023)   PRAPARE - Administrator, Civil Service (Medical): No    Lack of Transportation (Non-Medical): No  Physical Activity: Sufficiently Active (12/05/2023)   Exercise Vital Sign    Days of Exercise per Week: 6 days    Minutes of Exercise per Session: 150+ min  Stress: No Stress Concern Present (12/05/2023)   [employer] of Occupational Health - Occupational Stress Questionnaire    Feeling of Stress : Not at all  Social Connections: Moderately Integrated (12/05/2023)   Social Connection and Isolation Panel [NHANES]    Frequency of Communication with Friends and Family: More than three times a week    Frequency of Social Gatherings with Friends and Family: More than three times a week    Attends Religious Services: Never    Database Administrator or Organizations: Yes    Attends Banker Meetings: Never    Marital Status: Married    Tobacco Counseling Counseling given: Not Answered Tobacco comments: parents smoked growing up; stopped when he was a teenager   Clinical Intake:  Pre-visit preparation completed: Yes  Pain : No/denies pain     BMI - recorded: 30.36 Nutritional Status: BMI > 30  Obese Nutritional Risks: None Diabetes: No  How often do you need to have someone help you when you read instructions, pamphlets, or other written materials from your doctor or pharmacy?: 1 - Never  Interpreter Needed?: No  Information entered by :: Rojelio Blush LP   Activities of Daily Living    12/05/2023   10:19 AM  In your present state of health, do you have any difficulty performing the  following activities:  Hearing? 0  Vision? 0  Difficulty concentrating or making decisions? 0  Walking or climbing stairs? 0  Dressing or bathing? 0  Doing errands, shopping? 0  Preparing Food and eating ? N  Using the Toilet? N  In the past six months, have you accidently leaked urine? N  Do you have problems with loss of bowel control? N  Managing your Medications? N  Managing your Finances? N  Housekeeping or managing your Housekeeping? N    Patient Care Team: Theophilus Andrews, Tully GRADE, MD as PCP - General (Internal Medicine)  Indicate any recent Medical Services you may have received from other than [facility] providers in the past year (date may be approximate).     Assessment:   This is a routine wellness examination for Mason.  Hearing/Vision screen Hearing Screening - Comments:: Denies hearing difficulties   Vision Screening - Comments:: Wears rx glasses - up to date with routine eye exams with  [facility]   Goals Addressed               This Visit's Progress     Stay Active (pt-stated)        Continue personal projects.       Depression Screen    12/05/2023   10:05 AM 11/21/2022    9:17 AM 03/15/2021    9:06 AM 03/11/2020   11:32 AM 01/29/2018    4:33 PM 06/12/2016    8:16 AM  PHQ 2/9 Scores  PHQ - 2 Score 0 0 1 0 0 0  PHQ- 9 Score  1 2 0      Fall Risk    12/05/2023   10:20 AM 12/26/2022    3:55 PM 11/21/2022    9:17 AM 03/15/2021    9:06 AM 03/11/2020   11:31 AM  Fall Risk   Falls in the past year? 1 0 1 0 0  Number falls in past yr: 0  0 0 0  Injury with Fall? 0  1 0 0  Risk for fall due to : No Fall Risks  No Fall Risks    Follow up Falls prevention discussed;Falls evaluation completed  Falls evaluation completed      MEDICARE RISK AT HOME: Medicare Risk at Home Any stairs in or around the home?: Yes If so, are there any without handrails?: No Home free of loose throw rugs in walkways, pet beds, electrical cords, etc?: Yes Adequate lighting  in your home to reduce risk of falls?: Yes Life alert?: No Use of a cane, walker or w/c?: No Grab bars in the bathroom?: No Shower chair or bench in shower?: Yes Elevated toilet seat or a handicapped toilet?: No  TIMED UP AND GO:  Was the test performed?  Yes  Length of time to ambulate 10 feet: 10 sec Gait steady and fast without use of assistive device    Cognitive Function:    01/29/2018    4:36 PM  MMSE - Mini Mental State Exam  Not completed: --        12/05/2023   10:22 AM  6CIT Screen  What Year? 0 points  What month? 0 points  What time? 0 points  Count back from 20 0 points  Months in reverse 0 points  Repeat phrase 0 points  Total Score 0 points    Immunizations Immunization History  Administered Date(s) Administered   Fluad Quad(high Dose 65+) 07/29/2019   Influenza Split 08/10/2011   Influenza Whole 07/22/2010   Influenza, High Dose Seasonal PF 11/20/2016, 09/19/2018   Influenza-Unspecified 08/18/2020, 07/30/2022, 07/31/2023   Moderna Covid-19 Fall Seasonal Vaccine 76yrs & older 08/07/2023   Moderna Covid-19 Vaccine  Bivalent Booster 73yrs & up 07/30/2022   PFIZER(Purple Top)SARS-COV-2 Vaccination 11/20/2019, 12/11/2019, 08/18/2020   Pfizer Covid-19 Vaccine Bivalent Booster 77yrs & up 07/30/2022   Pneumococcal Conjugate-13 06/12/2016   Pneumococcal Polysaccharide-23 01/29/2018   Td 10/30/2002   Tdap  02/05/2014   Zoster Recombinant(Shingrix) 06/11/2021, 02/15/2022   Zoster, Live 08/10/2011    TDAP status: Up to date  Flu Vaccine status: Up to date  Pneumococcal vaccine status: Up to date  Covid-19 vaccine status: Completed vaccines  Qualifies for Shingles Vaccine? Yes   Zostavax completed Yes   Shingrix Completed?: Yes  Screening Tests Health Maintenance  Topic Date Due   DTaP/Tdap/Td (3 - Td or Tdap) 02/06/2024   Medicare Annual Wellness (AWV)  12/04/2024   Fecal DNA (Cologuard)  10/01/2026   Pneumonia Vaccine 36+ Years old  Completed    INFLUENZA VACCINE  Completed   COVID-19 Vaccine  Completed   Hepatitis C Screening  Completed   Zoster Vaccines- Shingrix  Completed   HPV VACCINES  Aged Out    Health Maintenance  There are no preventive care reminders to display for this patient.   Colorectal cancer screening: Type of screening: Cologuard. Completed 10/02/23. Repeat every 3 years     Additional Screening:  Hepatitis C Screening: does qualify; Completed 06/12/16  Vision Screening: Recommended annual ophthalmology exams for early detection of glaucoma and other disorders of the eye. Is the patient up to date with their annual eye exam?  Yes  Who is the provider or what is the name of the office in which the patient attends annual eye exams? [facility] If pt is not established with a provider, would they like to be referred to a provider to establish care? No .   Dental Screening: Recommended annual dental exams for proper oral hygiene    Community Resource Referral / Chronic Care Management:  CRR required this visit?  No   CCM required this visit?  No     Plan:     I have personally reviewed and noted the following in the patient's chart:   Medical and social history Use of alcohol, tobacco or illicit drugs  Current medications and supplements including opioid prescriptions. Patient is not currently taking opioid prescriptions. Functional ability and status Nutritional status Physical activity Advanced directives List of other physicians Hospitalizations, surgeries, and ER visits in previous 12 months Vitals Screenings to include cognitive, depression, and falls Referrals and appointments  In addition, I have reviewed and discussed with patient certain preventive protocols, quality metrics, and best practice recommendations. A written personalized care plan for preventive services as well as general preventive health recommendations were provided to patient.     Rojelio LELON Blush,  LPN   05/02/7973   After Visit Summary: (In Person-Printed) AVS printed and given to the patient  Nurse Notes: None

## 2023-12-11 ENCOUNTER — Encounter: Payer: Self-pay | Admitting: Internal Medicine

## 2023-12-14 DIAGNOSIS — T1512XA Foreign body in conjunctival sac, left eye, initial encounter: Secondary | ICD-10-CM | POA: Diagnosis not present

## 2023-12-14 DIAGNOSIS — D487 Neoplasm of uncertain behavior of other specified sites: Secondary | ICD-10-CM | POA: Diagnosis not present

## 2023-12-31 ENCOUNTER — Encounter: Payer: Self-pay | Admitting: Family Medicine

## 2023-12-31 ENCOUNTER — Ambulatory Visit (INDEPENDENT_AMBULATORY_CARE_PROVIDER_SITE_OTHER): Payer: Medicare Other | Admitting: Family Medicine

## 2023-12-31 VITALS — BP 120/80 | HR 60 | Resp 16 | Ht 70.0 in | Wt 214.1 lb

## 2023-12-31 DIAGNOSIS — H6121 Impacted cerumen, right ear: Secondary | ICD-10-CM | POA: Diagnosis not present

## 2023-12-31 DIAGNOSIS — M109 Gout, unspecified: Secondary | ICD-10-CM

## 2023-12-31 MED ORDER — PREDNISONE 20 MG PO TABS: 40.0000 mg | ORAL_TABLET | Freq: Every day | ORAL | 0 refills | Status: AC

## 2023-12-31 NOTE — Progress Notes (Signed)
 ACUTE VISIT Chief Complaint  Patient presents with   ears clogged    Goes swimming often, feels like water has gotten trapped in the right ear; has been using debrox    Gout    May have a possible gout flare up in the right toe    HPI: [name] is a 73 y.o. male with a PMHx significant for facial telangiectasia, GERD, right inguinal hernia, skin cancer, panic attacks, and HLD, who is here today complaining of ear problems after swimming.   Ear problems: Patient mentions that he has a hx of cerumen buildup and needs to have his ears cleaned frequently at his annual physicals. States that several members of his family have also had similar problems.  For about a week, he has felt a fullness sensation in his ears after swimming, which he does frequently.  He has tried Debrox ear drops, which have helped on the left side but not as much on the right side.  Pertinent negatives include ear pain or drainage, recent travel, or recent URI.  Mild decreased in hearing.  Right great toe pain Pain started last night. The toe is also swollen.  He rates the pain as a 4/10, and says it has improved since last night.  He had a similar pain last year that was much worse, and was told it was gout. At that time, he was given prednisone, which helped.  He has been taking Aleve for the pain, which helps.  Denies any recent injuries or new shoes.  No changes in his diet.  Review of Systems  Constitutional:  Negative for appetite change, chills and fever.  HENT:  Negative for congestion, facial swelling and sore throat.   Respiratory:  Negative for cough and shortness of breath.   Gastrointestinal:  Negative for abdominal pain, nausea and vomiting.  Genitourinary:  Negative for decreased urine volume, dysuria and hematuria.  Musculoskeletal:  Positive for arthralgias.  Skin:  Negative for rash.  Neurological:  Negative for dizziness, syncope, weakness and headaches.  See other pertinent  positives and negatives in HPI.  Current Outpatient Medications on File Prior to Visit  Medication Sig Dispense Refill   atorvastatin (LIPITOR) 40 MG tablet TAKE 1 TABLET BY MOUTH EVERY DAY 90 tablet 1   COLLAGEN PO Take 1 Scoop by mouth daily.     esomeprazole (NEXIUM) 20 MG capsule Take 20 mg by mouth every other day.     fish oil-omega-3 fatty acids 1000 MG capsule Take 1 g by mouth daily.     Probiotic Product (PROBIOTIC PO) Take 1 capsule by mouth daily.      VITAMIN D PO Take 2,500 Units by mouth daily.     No current facility-administered medications on file prior to visit.   Past Medical History:  Diagnosis Date   Back pain    low, hx radiculopathy, saw ortho in the past   Closed right ankle fracture 09/2015   per pt due to fall while hiking   Deviated nasal septum    GERD (gastroesophageal reflux disease)    History of kidney stones    kidney stones - h/o    Non-melanoma skin cancer    hx mohs on R face   Panic attack    PONV (postoperative nausea and vomiting)    Right inguinal hernia    Wears glasses    Allergies  Allergen Reactions   No Known Allergies    Social History   Socioeconomic History  Marital status: Married    Spouse name: Not on file   Number of children: 0   Years of education: Not on file   Highest education level: Bachelor's degree (e.g., BA, AB, BS)  Occupational History   Occupation: Research scientist (medical)  Tobacco Use   Smoking status: Never    Passive exposure: Never   Smokeless tobacco: Never   Tobacco comments:    parents smoked growing up; stopped when he was a teenager  Vaping Use   Vaping status: Never Used  Substance and Sexual Activity   Alcohol use: Yes    Alcohol/week: 8.0 - 10.0 standard drinks of alcohol    Types: 8 - 10 Glasses of wine per week   Drug use: No   Sexual activity: Yes  Other Topics Concern   Not on file  Social History Narrative   Not on file   Social Drivers of Health   Financial Resource Strain: Low Risk   (12/05/2023)   Overall Financial Resource Strain (CARDIA)    Difficulty of Paying Living Expenses: Not hard at all  Food Insecurity: No Food Insecurity (12/05/2023)   Hunger Vital Sign    Worried About Running Out of Food in the Last Year: Never true    Ran Out of Food in the Last Year: Never true  Transportation Needs: No Transportation Needs (12/05/2023)   PRAPARE - Administrator, Civil Service (Medical): No    Lack of Transportation (Non-Medical): No  Physical Activity: Sufficiently Active (12/05/2023)   Exercise Vital Sign    Days of Exercise per Week: 6 days    Minutes of Exercise per Session: 150+ min  Stress: No Stress Concern Present (12/05/2023)   [employer] of Occupational Health - Occupational Stress Questionnaire    Feeling of Stress : Not at all  Social Connections: Moderately Integrated (12/05/2023)   Social Connection and Isolation Panel [NHANES]    Frequency of Communication with Friends and Family: More than three times a week    Frequency of Social Gatherings with Friends and Family: More than three times a week    Attends Religious Services: Never    Database administrator or Organizations: Yes    Attends Banker Meetings: Never    Marital Status: Married   Vitals:   12/31/23 1042  BP: 120/80  Pulse: 60  Resp: 16  SpO2: 98%   Body mass index is 30.72 kg/m.  Physical Exam Vitals and nursing note reviewed.  Constitutional:      General: He is not in acute distress.    Appearance: He is well-developed.  HENT:     Head: Normocephalic and atraumatic.     Right Ear: External ear normal. There is impacted cerumen.     Left Ear: Tympanic membrane is not erythematous.     Ears:     Comments: Cerumen excess left ear canal, not impacted, TM seem partially.    Mouth/Throat:     Lips: No lesions.     Mouth: Mucous membranes are moist.  Eyes:     Conjunctiva/sclera: Conjunctivae normal.  Cardiovascular:     Rate and Rhythm: Normal rate  and regular rhythm.     Pulses:          Dorsalis pedis pulses are 2+ on the right side.     Heart sounds: No murmur heard. Pulmonary:     Effort: Pulmonary effort is normal. No respiratory distress.     Breath sounds: Normal breath sounds.  Musculoskeletal:  Right lower leg: No edema.     Right foot: Normal capillary refill. Swelling and tenderness present. Normal pulse.     Comments: Metatarsal phalangeal joint of right hallux with mild edema, erythema, and local heat. Pain with movement and palpation. Small bunion.  Lymphadenopathy:     Head:     Right side of head: No preauricular adenopathy.     Left side of head: No preauricular adenopathy.     Cervical: No cervical adenopathy.  Skin:    General: Skin is warm.     Findings: No erythema or rash.  Neurological:     Mental Status: He is alert and oriented to person, place, and time.     Cranial Nerves: No cranial nerve deficit.     Gait: Gait normal.  Psychiatric:        Mood and Affect: Mood and affect normal.   ASSESSMENT AND PLAN:  Mr. Holsopple was seen today for an ear problem.   Hearing loss of right ear due to cerumen impaction After verbal consent, he agrees with right ear irrigation. Left ear canal with cerumen excess but it is not impacted. Continue avoiding Q-tips. Continue using OTC Debrox.  Ear Cerumen Removal  Date/Time: 12/31/2023 11:27 AM  Performed by: [place], Kenn Rekowski G, MD Authorized by: [place], Oliwia Berzins G, MD   Anesthesia: Local Anesthetic: none Ceruminolytics applied: Ceruminolytics applied prior to the procedure. Location details: right ear Patient tolerance: patient tolerated the procedure well with no immediate complications Comments: TM normal, ear canal with no cerumen after irrigation. Procedure type: irrigation  Sedation: Patient sedated: no    Acute gout involving toe of right foot, unspecified cause We discussed diagnosis, prognosis, and treatment options. OA is also in the  differential diagnosis. I do not think imaging is needed at this time. Prednisone has helped in the past, recommend 40 mg daily with breakfast for 3 to 5 days.  We discussed some side effects. Instructed to avoid taking prednisone and NSAIDs at the same time. Low purine diet recommended. If he has another episode, prophylactic treatment can be considered. Follow-up as needed.  -     predniSONE; Take 2 tablets (40 mg total) by mouth daily with breakfast for 5 days.  Dispense: 10 tablet; Refill: 0  Return if symptoms worsen or fail to improve.  I, Rolla Etienne Wierda, acting as a scribe for Chellsie Gomer [place], MD., have documented all relevant documentation on the behalf of Rodgers Likes [place], MD, as directed by  Bryan Omura [place], MD while in the presence of Maanav Kassabian [place], MD.   I, Geanna Divirgilio [place], MD, have reviewed all documentation for this visit. The documentation on 12/31/23 for the exam, diagnosis, procedures, and orders are all accurate and complete.  Hjalmer Iovino G. [place], MD  [facility]. [place] office.

## 2023-12-31 NOTE — Patient Instructions (Addendum)
 A few things to remember from today's visit:  Acute gout involving toe of right foot, unspecified cause - Plan: predniSONE (DELTASONE) 20 MG tablet  Hearing loss of right ear due to cerumen impaction Do not take Prednisone at the same time with Aleve.  If you need refills for medications you take chronically, please call your pharmacy. Do not use My Chart to request refills or for acute issues that need immediate attention. If you send a my chart message, it may take a few days to be addressed, specially if I am not in the office.  Please be sure medication list is accurate. If a new problem present, please set up appointment sooner than planned today.

## 2024-02-07 ENCOUNTER — Other Ambulatory Visit: Payer: Self-pay | Admitting: Internal Medicine

## 2024-02-07 ENCOUNTER — Ambulatory Visit: Payer: Medicare Other | Admitting: Internal Medicine

## 2024-02-07 ENCOUNTER — Encounter: Payer: Self-pay | Admitting: Internal Medicine

## 2024-02-07 ENCOUNTER — Other Ambulatory Visit (INDEPENDENT_AMBULATORY_CARE_PROVIDER_SITE_OTHER)

## 2024-02-07 DIAGNOSIS — R5383 Other fatigue: Secondary | ICD-10-CM

## 2024-02-07 DIAGNOSIS — R030 Elevated blood-pressure reading, without diagnosis of hypertension: Secondary | ICD-10-CM

## 2024-02-07 DIAGNOSIS — E782 Mixed hyperlipidemia: Secondary | ICD-10-CM

## 2024-02-07 DIAGNOSIS — Z6830 Body mass index (BMI) 30.0-30.9, adult: Secondary | ICD-10-CM

## 2024-02-07 LAB — COMPREHENSIVE METABOLIC PANEL WITH GFR
ALT: 18 U/L (ref 0–53)
AST: 19 U/L (ref 0–37)
Albumin: 4.6 g/dL (ref 3.5–5.2)
Alkaline Phosphatase: 49 U/L (ref 39–117)
BUN: 14 mg/dL (ref 6–23)
CO2: 28 meq/L (ref 19–32)
Calcium: 9.6 mg/dL (ref 8.4–10.5)
Chloride: 101 meq/L (ref 96–112)
Creatinine, Ser: 0.95 mg/dL (ref 0.40–1.50)
GFR: 79.65 mL/min (ref >60.00)
Glucose, Bld: 96 mg/dL (ref 70–99)
Potassium: 4.2 meq/L (ref 3.5–5.1)
Sodium: 137 meq/L (ref 135–145)
Total Bilirubin: 0.6 mg/dL (ref 0.2–1.2)
Total Protein: 7.3 g/dL (ref 6.0–8.3)

## 2024-02-07 LAB — CBC WITH DIFFERENTIAL/PLATELET
Basophils Absolute: 0.1 10*3/uL (ref 0.0–0.1)
Basophils Relative: 1.3 % (ref 0.0–3.0)
Eosinophils Absolute: 0.1 10*3/uL (ref 0.0–0.7)
Eosinophils Relative: 1.7 % (ref 0.0–5.0)
HCT: 48.2 % (ref 39.0–52.0)
Hemoglobin: 16.4 g/dL (ref 13.0–17.0)
Lymphocytes Relative: 31.3 % (ref 12.0–46.0)
Lymphs Abs: 1.7 10*3/uL (ref 0.7–4.0)
MCHC: 34.1 g/dL (ref 30.0–36.0)
MCV: 92.4 fl (ref 78.0–100.0)
Monocytes Absolute: 0.5 10*3/uL (ref 0.1–1.0)
Monocytes Relative: 10.1 % (ref 3.0–12.0)
Neutro Abs: 3 10*3/uL (ref 1.4–7.7)
Neutrophils Relative %: 55.6 % (ref 43.0–77.0)
Platelets: 215 10*3/uL (ref 150.0–400.0)
RBC: 5.21 Mil/uL (ref 4.22–5.81)
RDW: 13.4 % (ref 11.5–15.5)
WBC: 5.4 10*3/uL (ref 4.0–10.5)

## 2024-02-07 LAB — LIPID PANEL
Cholesterol: 207 mg/dL — ABNORMAL HIGH (ref 0–200)
HDL: 44.1 mg/dL (ref >39.00)
LDL Cholesterol: 138 mg/dL — ABNORMAL HIGH (ref 0–99)
NonHDL: 162.99
Total CHOL/HDL Ratio: 5
Triglycerides: 123 mg/dL (ref 0.0–149.0)
VLDL: 24.6 mg/dL (ref 0.0–40.0)

## 2024-02-07 LAB — VITAMIN D 25 HYDROXY (VIT D DEFICIENCY, FRACTURES): VITD: 52.42 ng/mL (ref 30.00–100.00)

## 2024-02-07 LAB — VITAMIN B12: Vitamin B-12: 245 pg/mL (ref 211–911)

## 2024-02-21 ENCOUNTER — Encounter: Payer: Self-pay | Admitting: Internal Medicine

## 2024-02-21 ENCOUNTER — Ambulatory Visit: Admitting: Internal Medicine

## 2024-02-21 VITALS — BP 154/84 | HR 65 | Temp 98.7°F | Resp 16 | Ht 70.0 in | Wt 205.0 lb

## 2024-02-21 DIAGNOSIS — E782 Mixed hyperlipidemia: Secondary | ICD-10-CM | POA: Diagnosis not present

## 2024-02-21 MED ORDER — ROSUVASTATIN CALCIUM 5 MG PO TABS: 5.0000 mg | ORAL_TABLET | Freq: Every day | ORAL | 1 refills | Status: DC

## 2024-02-21 NOTE — Progress Notes (Signed)
 Established Patient Office Visit     CC/Reason for Visit: Discuss hyperlipidemia  HPI: Andrew Mcmahon is a 73 y.o. male who is coming in today for the above mentioned reasons.  He has a history of hyperlipidemia.  Has been on atorvastatin  40 mg daily which he discontinued due to significant stiffness and muscle aches.  He is here today to discuss alternative options.   Past Medical/Surgical History: Past Medical History:  Diagnosis Date   Back pain    low, hx radiculopathy, saw ortho in the past   Closed right ankle fracture 09/2015   per pt due to fall while hiking   Deviated nasal septum    GERD (gastroesophageal reflux disease)    History of kidney stones    kidney stones - h/o    Non-melanoma skin cancer    hx mohs on R face   Panic attack    PONV (postoperative nausea and vomiting)    Right inguinal hernia    Wears glasses     Past Surgical History:  Procedure Laterality Date   COLONOSCOPY     INGUINAL HERNIA REPAIR Right 11/29/2016   Procedure: LAPAROSCOPIC RIGHT  INGUINAL HERNIA;  Surgeon: Oza Blumenthal, MD;  Location: [facility] OR;  Service: General;  Laterality: Right;   INSERTION OF MESH Right 11/29/2016   Procedure: INSERTION OF MESH;  Surgeon: Oza Blumenthal, MD;  Location: [facility] OR;  Service: General;  Laterality: Right;   MASS EXCISION  08/08/2012   Procedure: MINOR EXCISION OF MASS;  Surgeon: Amelie Baize., MD;  Location: [facility];  Service: Orthopedics;  Laterality: Right;  Minor Excision of Foreign Body   MOHS SURGERY     NASAL SEPTUM SURGERY     WISDOM TOOTH EXTRACTION  1980   XI ROBOTIC ASSISTED INGUINAL HERNIA REPAIR WITH MESH Left 11/09/2020   Procedure: ROBOTIC LEFT INGUINAL HERNIA WITH MESH;  Surgeon: Shela Derby, MD;  Location: [facility] OR;  Service: General;  Laterality: Left;    Social History:  reports that he has never smoked. He has never been exposed to tobacco smoke. He has never used smokeless tobacco. He reports  current alcohol use of about 8.0 - 10.0 standard drinks of alcohol per week. He reports that he does not use drugs.  Allergies: Allergies  Allergen Reactions   No Known Allergies     Family History:  Family History  Problem Relation Age of Onset   Liver disease Other    Multiple myeloma Father        died at 68      Current Outpatient Medications:    COLLAGEN PO, Take 1 Scoop by mouth daily., Disp: , Rfl:    esomeprazole (NEXIUM) 20 MG capsule, Take 20 mg by mouth every other day., Disp: , Rfl:    fish oil-omega-3 fatty acids 1000 MG capsule, Take 1 g by mouth daily., Disp: , Rfl:    Probiotic Product (PROBIOTIC PO), Take 1 capsule by mouth daily. , Disp: , Rfl:    rosuvastatin  (CRESTOR ) 5 MG tablet, Take 1 tablet (5 mg total) by mouth daily., Disp: 90 tablet, Rfl: 1   Turmeric (QC TUMERIC COMPLEX PO), Take by mouth., Disp: , Rfl:    VITAMIN D  PO, Take 2,500 Units by mouth daily., Disp: , Rfl:   Review of Systems:  Negative unless indicated in HPI.   Physical Exam: Vitals:   02/21/24 1051  BP: (!) 154/84  Pulse: 65  Resp: 16  Temp:  98.7 F (37.1 C)  TempSrc: Oral  SpO2: 96%  Weight: 205 lb (93 kg)  Height: 5\' 10"  (1.778 m)    Body mass index is 29.41 kg/m.    Impression and Plan:  Mixed hyperlipidemia -     Rosuvastatin  Calcium ; Take 1 tablet (5 mg total) by mouth daily.  Dispense: 90 tablet; Refill: 1   - Recent cholesterol panel with a total cholesterol of 207, LDL 138, HDL 44 and triglycerides 123. - We have decided to start rosuvastatin  5 mg 3 days a week and titrate up as tolerated.  He will return in 3 months for repeat lipid panel.  Time spent:24 minutes reviewing chart, interviewing and examining patient and formulating plan of care.     Marguerita Shih, MD [facility]

## 2024-03-03 ENCOUNTER — Telehealth: Payer: Self-pay

## 2024-03-03 DIAGNOSIS — E782 Mixed hyperlipidemia: Secondary | ICD-10-CM

## 2024-03-03 NOTE — Telephone Encounter (Signed)
 Copied from CRM [id number]. Topic: Clinical - Request for Lab/Test Order >> Mar 03, 2024 10:04 AM Elle L wrote: Reason for CRM: The patient is requesting lab orders and a lab appointment for 3 months after his last appointment which was on 4/24. The patient's call back number is [phone].

## 2024-03-03 NOTE — Addendum Note (Signed)
 Addended by: Nicolina Barrios B on: 03/03/2024 01:42 PM   Modules accepted: Orders

## 2024-03-03 NOTE — Telephone Encounter (Signed)
 Patient is requesting lipid levels to be checked.  Lab order and appointment placed.

## 2024-03-27 DIAGNOSIS — M25571 Pain in right ankle and joints of right foot: Secondary | ICD-10-CM | POA: Diagnosis not present

## 2024-03-27 DIAGNOSIS — M19171 Post-traumatic osteoarthritis, right ankle and foot: Secondary | ICD-10-CM | POA: Diagnosis not present

## 2024-06-03 ENCOUNTER — Other Ambulatory Visit (INDEPENDENT_AMBULATORY_CARE_PROVIDER_SITE_OTHER)

## 2024-06-03 DIAGNOSIS — E782 Mixed hyperlipidemia: Secondary | ICD-10-CM | POA: Diagnosis not present

## 2024-06-03 LAB — LIPID PANEL
Cholesterol: 183 mg/dL (ref 0–200)
HDL: 45.1 mg/dL (ref >39.00)
LDL Cholesterol: 112 mg/dL — ABNORMAL HIGH (ref 0–99)
NonHDL: 137.68
Total CHOL/HDL Ratio: 4
Triglycerides: 128 mg/dL (ref 0.0–149.0)
VLDL: 25.6 mg/dL (ref 0.0–40.0)

## 2024-06-24 ENCOUNTER — Ambulatory Visit: Payer: Self-pay | Admitting: Internal Medicine

## 2024-07-08 DIAGNOSIS — H2513 Age-related nuclear cataract, bilateral: Secondary | ICD-10-CM | POA: Diagnosis not present

## 2024-07-08 DIAGNOSIS — D3102 Benign neoplasm of left conjunctiva: Secondary | ICD-10-CM | POA: Diagnosis not present

## 2024-07-08 DIAGNOSIS — H5203 Hypermetropia, bilateral: Secondary | ICD-10-CM | POA: Diagnosis not present

## 2024-07-08 DIAGNOSIS — D3101 Benign neoplasm of right conjunctiva: Secondary | ICD-10-CM | POA: Diagnosis not present

## 2024-08-20 DIAGNOSIS — Z23 Encounter for immunization: Secondary | ICD-10-CM | POA: Diagnosis not present

## 2024-09-22 DIAGNOSIS — R972 Elevated prostate specific antigen [PSA]: Secondary | ICD-10-CM | POA: Diagnosis not present

## 2024-09-30 DIAGNOSIS — R972 Elevated prostate specific antigen [PSA]: Secondary | ICD-10-CM | POA: Diagnosis not present

## 2024-10-14 DIAGNOSIS — L821 Other seborrheic keratosis: Secondary | ICD-10-CM | POA: Diagnosis not present

## 2024-10-14 DIAGNOSIS — L853 Xerosis cutis: Secondary | ICD-10-CM | POA: Diagnosis not present

## 2024-10-14 DIAGNOSIS — Z85828 Personal history of other malignant neoplasm of skin: Secondary | ICD-10-CM | POA: Diagnosis not present

## 2024-10-14 DIAGNOSIS — D1801 Hemangioma of skin and subcutaneous tissue: Secondary | ICD-10-CM | POA: Diagnosis not present

## 2024-10-14 DIAGNOSIS — L718 Other rosacea: Secondary | ICD-10-CM | POA: Diagnosis not present

## 2024-10-18 ENCOUNTER — Other Ambulatory Visit: Payer: Self-pay | Admitting: Internal Medicine

## 2024-10-18 DIAGNOSIS — E782 Mixed hyperlipidemia: Secondary | ICD-10-CM

## 2024-12-10 ENCOUNTER — Ambulatory Visit: Payer: Medicare Other
